# Patient Record
Sex: Male | Born: 1975 | Race: White | Hispanic: No | Marital: Single | Smoking: Current every day smoker
Health system: Southern US, Community
[De-identification: ages and names within clinical notes are randomized; demographics above are authoritative.]

## PROBLEM LIST (undated history)

## (undated) DIAGNOSIS — J189 Pneumonia, unspecified organism: Secondary | ICD-10-CM

## (undated) DIAGNOSIS — M549 Dorsalgia, unspecified: Secondary | ICD-10-CM

## (undated) HISTORY — PX: BACK SURGERY: SHX140

## (undated) HISTORY — PX: APPENDECTOMY: SHX54

---

## 1998-03-12 ENCOUNTER — Emergency Department (HOSPITAL_COMMUNITY): Admission: EM | Admit: 1998-03-12 | Discharge: 1998-03-12 | Payer: Self-pay | Admitting: Emergency Medicine

## 1998-06-19 ENCOUNTER — Encounter: Payer: Self-pay | Admitting: Neurosurgery

## 1998-06-20 ENCOUNTER — Ambulatory Visit (HOSPITAL_COMMUNITY): Admission: RE | Admit: 1998-06-20 | Discharge: 1998-06-20 | Payer: Self-pay | Admitting: Neurosurgery

## 1998-06-30 ENCOUNTER — Emergency Department (HOSPITAL_COMMUNITY): Admission: EM | Admit: 1998-06-30 | Discharge: 1998-06-30 | Payer: Self-pay | Admitting: Emergency Medicine

## 2000-02-02 ENCOUNTER — Emergency Department (HOSPITAL_COMMUNITY): Admission: EM | Admit: 2000-02-02 | Discharge: 2000-02-02 | Payer: Self-pay | Admitting: Emergency Medicine

## 2001-07-04 ENCOUNTER — Encounter: Payer: Self-pay | Admitting: Emergency Medicine

## 2001-07-04 ENCOUNTER — Emergency Department (HOSPITAL_COMMUNITY): Admission: EM | Admit: 2001-07-04 | Discharge: 2001-07-04 | Payer: Self-pay | Admitting: Emergency Medicine

## 2002-01-05 ENCOUNTER — Emergency Department (HOSPITAL_COMMUNITY): Admission: EM | Admit: 2002-01-05 | Discharge: 2002-01-05 | Payer: Self-pay | Admitting: Emergency Medicine

## 2002-01-05 ENCOUNTER — Encounter: Payer: Self-pay | Admitting: Emergency Medicine

## 2002-05-25 ENCOUNTER — Encounter: Payer: Self-pay | Admitting: Emergency Medicine

## 2002-05-25 ENCOUNTER — Emergency Department (HOSPITAL_COMMUNITY): Admission: EM | Admit: 2002-05-25 | Discharge: 2002-05-25 | Payer: Self-pay | Admitting: Emergency Medicine

## 2003-08-07 ENCOUNTER — Emergency Department (HOSPITAL_COMMUNITY): Admission: EM | Admit: 2003-08-07 | Discharge: 2003-08-07 | Payer: Self-pay | Admitting: Emergency Medicine

## 2005-06-03 ENCOUNTER — Encounter: Payer: Self-pay | Admitting: Neurosurgery

## 2006-04-27 ENCOUNTER — Emergency Department (HOSPITAL_COMMUNITY): Admission: EM | Admit: 2006-04-27 | Discharge: 2006-04-27 | Payer: Self-pay | Admitting: *Deleted

## 2006-05-04 ENCOUNTER — Emergency Department (HOSPITAL_COMMUNITY): Admission: EM | Admit: 2006-05-04 | Discharge: 2006-05-04 | Payer: Self-pay | Admitting: Emergency Medicine

## 2006-09-06 ENCOUNTER — Emergency Department (HOSPITAL_COMMUNITY): Admission: EM | Admit: 2006-09-06 | Discharge: 2006-09-06 | Payer: Self-pay | Admitting: Emergency Medicine

## 2006-10-11 ENCOUNTER — Emergency Department (HOSPITAL_COMMUNITY): Admission: EM | Admit: 2006-10-11 | Discharge: 2006-10-11 | Payer: Self-pay | Admitting: Obstetrics & Gynecology

## 2007-04-24 ENCOUNTER — Emergency Department (HOSPITAL_COMMUNITY): Admission: EM | Admit: 2007-04-24 | Discharge: 2007-04-24 | Payer: Self-pay | Admitting: Emergency Medicine

## 2007-05-02 ENCOUNTER — Ambulatory Visit (HOSPITAL_COMMUNITY): Admission: RE | Admit: 2007-05-02 | Discharge: 2007-05-02 | Payer: Self-pay | Admitting: Orthopedic Surgery

## 2008-11-19 ENCOUNTER — Emergency Department (HOSPITAL_COMMUNITY): Admission: EM | Admit: 2008-11-19 | Discharge: 2008-11-19 | Payer: Self-pay | Admitting: Emergency Medicine

## 2008-12-22 ENCOUNTER — Emergency Department (HOSPITAL_COMMUNITY): Admission: EM | Admit: 2008-12-22 | Discharge: 2008-12-23 | Payer: Self-pay | Admitting: Emergency Medicine

## 2009-12-14 ENCOUNTER — Emergency Department (HOSPITAL_BASED_OUTPATIENT_CLINIC_OR_DEPARTMENT_OTHER): Admission: EM | Admit: 2009-12-14 | Discharge: 2009-12-14 | Payer: Self-pay | Admitting: Emergency Medicine

## 2009-12-14 ENCOUNTER — Ambulatory Visit: Payer: Self-pay | Admitting: Diagnostic Radiology

## 2010-08-24 ENCOUNTER — Encounter: Payer: Self-pay | Admitting: *Deleted

## 2010-08-24 ENCOUNTER — Emergency Department (HOSPITAL_COMMUNITY)
Admission: EM | Admit: 2010-08-24 | Discharge: 2010-08-24 | Disposition: A | Discharge status: Home / Self Care | Payer: Self-pay | Attending: Emergency Medicine | Admitting: Emergency Medicine

## 2010-08-24 DIAGNOSIS — R51 Headache: Secondary | ICD-10-CM | POA: Insufficient documentation

## 2010-08-24 DIAGNOSIS — F17200 Nicotine dependence, unspecified, uncomplicated: Secondary | ICD-10-CM | POA: Insufficient documentation

## 2010-08-24 MED ORDER — OXYCODONE-ACETAMINOPHEN 5-325 MG PO TABS
1.0000 | ORAL_TABLET | Freq: Once | ORAL | Status: AC
  Administered 2010-08-24: 1 via ORAL
  Filled 2010-08-24: qty 1

## 2010-08-24 MED ORDER — KETOROLAC TROMETHAMINE 60 MG/2ML IM SOLN
60.0000 mg | Freq: Once | INTRAMUSCULAR | Status: AC
  Administered 2010-08-24: 60 mg via INTRAMUSCULAR
  Filled 2010-08-24: qty 2

## 2010-08-24 MED ORDER — HYDROCODONE-ACETAMINOPHEN 5-500 MG PO TABS: 1.0000 | ORAL_TABLET | Freq: Four times a day (QID) | ORAL | Status: AC | PRN

## 2010-08-24 NOTE — ED Provider Notes (Signed)
History     Chief Complaint  Patient presents with  . Facial Pain   Patient is a 35 y.o. male presenting with headaches. The history is provided by the patient.  Headache  This is a new problem. The current episode started more than 1 week ago. The problem occurs constantly. The problem has not changed since onset.The headache is associated with nothing. The quality of the pain is described as sharp. The pain is moderate. Radiates to: radiates from top of head towards left face. Pertinent negatives include no anorexia, no fever, no malaise/fatigue, no nausea and no vomiting. Associated symptoms comments: No weakness of arms or legs. No speech difficultiies. No facial asymetry noted. No visual changes. . He has tried NSAIDs for the symptoms. The treatment provided no relief.    History reviewed. No pertinent past medical history.  Past Surgical History  Procedure Date  . Back surgery   . Appendectomy     History reviewed. No pertinent family history.  History  Substance Use Topics  . Smoking status: Current Everyday Smoker  . Smokeless tobacco: Not on file  . Alcohol Use: Yes     occasional      Review of Systems  Constitutional: Negative for fever and malaise/fatigue.  Gastrointestinal: Negative for nausea, vomiting and anorexia.  Neurological: Positive for headaches.  All other systems reviewed and are negative.    Physical Exam  BP 114/70  Pulse 87  Temp 97.6 F (36.4 C)  Resp 20  Ht 5\' 9"  (1.753 m)  Wt 155 lb (70.308 kg)  BMI 22.89 kg/m2  SpO2 100%  Physical Exam  Nursing note and vitals reviewed. Constitutional: He is oriented to person, place, and time. He appears well-developed and well-nourished.  HENT:  Head: Normocephalic and atraumatic.  Eyes: EOM are normal.  Neck: Normal range of motion.  Cardiovascular: Normal rate, regular rhythm, normal heart sounds and intact distal pulses.   Pulmonary/Chest: Effort normal and breath sounds normal. No  respiratory distress.  Abdominal: Soft. He exhibits no distension. There is no tenderness.  Musculoskeletal: Normal range of motion.  Neurological: He is alert and oriented to person, place, and time. He has normal strength. Coordination normal. GCS eye subscore is 4. GCS verbal subscore is 5. GCS motor subscore is 6.  Skin: Skin is warm and dry.  Psychiatric: He has a normal mood and affect. Judgment normal.    ED Course  Procedures  MDM Improvement in symptoms with pain meds in the ER. No indication for imaging at this time. Will refer the pt to neuro for followup. Walking and standing in the room without difficulty      Lyanne Co, MD 08/24/10 0530

## 2010-08-24 NOTE — ED Notes (Signed)
Pt c/o facial pain/pressure starting at the top of his head to his teeth and neck. Pt states it only happens when he lies down to go to sleep.

## 2010-08-24 NOTE — ED Notes (Signed)
Pt c/o pain that starts from top of left of side of head down on left side of face into lt neck, patient noted to have broken tooth to lt upper jaw; pain ongoing for 2 weeks and worse with laying down

## 2010-11-14 LAB — COMPREHENSIVE METABOLIC PANEL
AST: 32
Albumin: 4.3
BUN: 5 — ABNORMAL LOW
CO2: 30
Calcium: 9.4
Chloride: 107
Creatinine, Ser: 0.72
GFR calc Af Amer: >60
GFR calc non Af Amer: >60
Total Bilirubin: 0.7

## 2010-11-14 LAB — DIFFERENTIAL
Basophils Absolute: 0.1
Eosinophils Relative: 2
Lymphocytes Relative: 34
Lymphs Abs: 2.8
Neutro Abs: 4.7

## 2010-11-14 LAB — LIPASE, BLOOD: Lipase: 30

## 2010-11-14 LAB — CBC
HCT: 45.7
MCHC: 35.7
MCV: 96.9
Platelets: 166

## 2011-04-01 ENCOUNTER — Encounter (HOSPITAL_BASED_OUTPATIENT_CLINIC_OR_DEPARTMENT_OTHER): Payer: Self-pay

## 2011-04-01 ENCOUNTER — Emergency Department (HOSPITAL_BASED_OUTPATIENT_CLINIC_OR_DEPARTMENT_OTHER)
Admission: EM | Admit: 2011-04-01 | Discharge: 2011-04-01 | Disposition: A | Discharge status: Home / Self Care | Payer: Self-pay | Attending: Emergency Medicine | Admitting: Emergency Medicine

## 2011-04-01 ENCOUNTER — Emergency Department (INDEPENDENT_AMBULATORY_CARE_PROVIDER_SITE_OTHER): Payer: Self-pay

## 2011-04-01 DIAGNOSIS — Z8701 Personal history of pneumonia (recurrent): Secondary | ICD-10-CM | POA: Insufficient documentation

## 2011-04-01 DIAGNOSIS — Z9889 Other specified postprocedural states: Secondary | ICD-10-CM | POA: Insufficient documentation

## 2011-04-01 DIAGNOSIS — J40 Bronchitis, not specified as acute or chronic: Secondary | ICD-10-CM | POA: Insufficient documentation

## 2011-04-01 DIAGNOSIS — F17200 Nicotine dependence, unspecified, uncomplicated: Secondary | ICD-10-CM | POA: Insufficient documentation

## 2011-04-01 DIAGNOSIS — R52 Pain, unspecified: Secondary | ICD-10-CM

## 2011-04-01 DIAGNOSIS — R509 Fever, unspecified: Secondary | ICD-10-CM

## 2011-04-01 DIAGNOSIS — R05 Cough: Secondary | ICD-10-CM

## 2011-04-01 HISTORY — DX: Pneumonia, unspecified organism: J18.9

## 2011-04-01 MED ORDER — HYDROCODONE-ACETAMINOPHEN 5-325 MG PO TABS: 2.0000 | ORAL_TABLET | ORAL | Status: AC | PRN

## 2011-04-01 MED ORDER — AZITHROMYCIN 250 MG PO TABS: 250.0000 mg | ORAL_TABLET | Freq: Every day | ORAL | Status: AC

## 2011-04-01 MED ORDER — IPRATROPIUM BROMIDE 0.02 % IN SOLN
RESPIRATORY_TRACT | Status: AC
  Filled 2011-04-01: qty 2.5

## 2011-04-01 MED ORDER — IPRATROPIUM BROMIDE 0.02 % IN SOLN
0.5000 mg | RESPIRATORY_TRACT | Status: DC
  Administered 2011-04-01: 0.5 mg via RESPIRATORY_TRACT

## 2011-04-01 MED ORDER — ALBUTEROL SULFATE (5 MG/ML) 0.5% IN NEBU
5.0000 mg | INHALATION_SOLUTION | Freq: Once | RESPIRATORY_TRACT | Status: AC
  Administered 2011-04-01: 5 mg via RESPIRATORY_TRACT
  Filled 2011-04-01: qty 1

## 2011-04-01 MED ORDER — ALBUTEROL SULFATE HFA 108 (90 BASE) MCG/ACT IN AERS: 1.0000 | INHALATION_SPRAY | Freq: Four times a day (QID) | RESPIRATORY_TRACT | Status: DC | PRN

## 2011-04-01 MED ORDER — HYDROCODONE-ACETAMINOPHEN 5-325 MG PO TABS
2.0000 | ORAL_TABLET | Freq: Once | ORAL | Status: AC
  Administered 2011-04-01: 2 via ORAL
  Filled 2011-04-01: qty 2

## 2011-04-01 NOTE — ED Provider Notes (Signed)
History     CSN: [id number]  Arrival date & time 04/01/11  1236   First MD Initiated Contact with Patient 04/01/11 1352      Chief Complaint  Patient presents with  . URI    (Consider location/radiation/quality/duration/timing/severity/associated sxs/prior treatment) Patient is a 36 y.o. male presenting with cough. The history is provided by the patient. No language interpreter was used.  Cough This is a new problem. The problem occurs constantly. The problem has been gradually worsening. The cough is productive of sputum. There has been no fever. He is not a smoker. His past medical history does not include pneumonia or asthma.  Pt complains of a cough and shortness of breath.  Pt is a smoker Pt reports he has had cough for a week.  Pt is worried taht he has pneumonia  Past Medical History  Diagnosis Date  . Pneumonia     Past Surgical History  Procedure Date  . Back surgery   . Appendectomy     No family history on file.  History  Substance Use Topics  . Smoking status: Current Everyday Smoker  . Smokeless tobacco: Not on file  . Alcohol Use: Yes     occasional      Review of Systems  Constitutional: Positive for fever.  Respiratory: Positive for cough.   All other systems reviewed and are negative.    Allergies  Review of patient's allergies indicates no known allergies.  Home Medications  No current outpatient prescriptions on file.  BP 116/52  Pulse 71  Temp(Src) 98.3 F (36.8 C) (Oral)  Resp 16  Ht 5\' 9"  (1.753 m)  Wt 155 lb (70.308 kg)  BMI 22.89 kg/m2  SpO2 98%  Physical Exam  Nursing note and vitals reviewed. Constitutional: He appears well-developed and well-nourished.  HENT:  Head: Normocephalic and atraumatic.  Right Ear: External ear normal.  Left Ear: External ear normal.  Nose: Nose normal.  Mouth/Throat: Oropharynx is clear and moist.  Eyes: Conjunctivae and EOM are normal. Pupils are equal, round, and reactive to light.    Neck: Normal range of motion. Neck supple.  Cardiovascular: Normal rate and normal heart sounds.   Pulmonary/Chest: Effort normal. He has wheezes.       Rhonchi all lobes  Abdominal: Soft. Bowel sounds are normal.  Musculoskeletal: Normal range of motion.  Neurological: He is alert.  Skin: Skin is warm.  Psychiatric: He has a normal mood and affect.    ED Course  Procedures (including critical care time)  Labs Reviewed - No data to display Dg Chest 2 View  04/01/2011  *RADIOLOGY REPORT*  Clinical Data: Cough, fever and body aches.  CHEST - 2 VIEW  Comparison: 12/14/2009.  Findings: Trachea is midline.  Heart size normal.  There may be mild scattered pulmonary parenchymal scarring.  Lungs are otherwise clear.  No pleural fluid.  IMPRESSION: No acute findings.  Original Report Authenticated By: Reyes Ivan, M.D.     No diagnosis found.    MDM     Pt given albuterol neb here.  AI will treat with zithromax and albuterol.  Pt given vicodin for pain from coughing     Langston Masker, [place] 04/01/11 1544

## 2011-04-01 NOTE — Discharge Instructions (Signed)
Smoking Cessation This document explains the best ways for you to quit smoking and new treatments to help. It lists new medicines that can double or triple your chances of quitting and quitting for good. It also considers ways to avoid relapses and concerns you may have about quitting, including weight gain. NICOTINE: A POWERFUL ADDICTION If you have tried to quit smoking, you know how hard it can be. It is hard because nicotine is a very addictive drug. For some people, it can be as addictive as heroin or cocaine. Usually, people make 2 or 3 tries, or more, before finally being able to quit. Each time you try to quit, you can learn about what helps and what hurts. Quitting takes hard work and a lot of effort, but you can quit smoking. QUITTING SMOKING IS ONE OF THE MOST IMPORTANT THINGS YOU WILL EVER DO.  You will live longer, feel better, and live better.   The impact on your body of quitting smoking is felt almost immediately:   Within 20 minutes, blood pressure decreases. Pulse returns to its normal level.   After 8 hours, carbon monoxide levels in the blood return to normal. Oxygen level increases.   After 24 hours, chance of heart attack starts to decrease. Breath, hair, and body stop smelling like smoke.   After 48 hours, damaged nerve endings begin to recover. Sense of taste and smell improve.   After 72 hours, the body is virtually free of nicotine. Bronchial tubes relax and breathing becomes easier.   After 2 to 12 weeks, lungs can hold more air. Exercise becomes easier and circulation improves.   Quitting will reduce your risk of having a heart attack, stroke, cancer, or lung disease:   After 1 year, the risk of coronary heart disease is cut in half.   After 5 years, the risk of stroke falls to the same as a nonsmoker.   After 10 years, the risk of lung cancer is cut in half and the risk of other cancers decreases significantly.   After 15 years, the risk of coronary heart  disease drops, usually to the level of a nonsmoker.   If you are pregnant, quitting smoking will improve your chances of having a healthy baby.   The people you live with, especially your children, will be healthier.   You will have extra money to spend on things other than cigarettes.  FIVE KEYS TO QUITTING Studies have shown that these 5 steps will help you quit smoking and quit for good. You have the best chances of quitting if you use them together: 1. Get ready.  2. Get support and encouragement.  3. Learn new skills and behaviors.  4. Get medicine to reduce your nicotine addiction and use it correctly.  5. Be prepared for relapse or difficult situations. Be determined to continue trying to quit, even if you do not succeed at first.  1. GET READY  Set a quit date.   Change your environment.   Get rid of ALL cigarettes, ashtrays, matches, and lighters in your home, car, and place of work.   Do not let people smoke in your home.   Review your past attempts to quit. Think about what worked and what did not.   Once you quit, do not smoke. NOT EVEN A PUFF!  2. GET SUPPORT AND ENCOURAGEMENT Studies have shown that you have a better chance of being successful if you have help. You can get support in many ways.  Tell   your family, friends, and coworkers that you are going to quit and need their support. Ask them not to smoke around you.   Talk to your caregivers (doctor, dentist, nurse, pharmacist, psychologist, and/or smoking counselor).   Get individual, group, or telephone counseling and support. The more counseling you have, the better your chances are of quitting. Programs are available at local hospitals and health centers. Call your local health department for information about programs in your area.   Spiritual beliefs and practices may help some smokers quit.   Quit meters are small computer programs online or downloadable that keep track of quit statistics, such as amount  of "quit-time," cigarettes not smoked, and money saved.   Many smokers find one or more of the many self-help books available useful in helping them quit and stay off tobacco.  3. LEARN NEW SKILLS AND BEHAVIORS  Try to distract yourself from urges to smoke. Talk to someone, go for a walk, or occupy your time with a task.   When you first try to quit, change your routine. Take a different route to work. Drink tea instead of coffee. Eat breakfast in a different place.   Do something to reduce your stress. Take a hot bath, exercise, or read a book.   Plan something enjoyable to do every day. Reward yourself for not smoking.   Explore interactive web-based programs that specialize in helping you quit.  4. GET MEDICINE AND USE IT CORRECTLY Medicines can help you stop smoking and decrease the urge to smoke. Combining medicine with the above behavioral methods and support can quadruple your chances of successfully quitting smoking. The [place] Food and Drug Administration (FDA) has approved 7 medicines to help you quit smoking. These medicines fall into 3 categories.  Nicotine replacement therapy (delivers nicotine to your body without the negative effects and risks of smoking):   Nicotine gum: Available over-the-counter.   Nicotine lozenges: Available over-the-counter.   Nicotine inhaler: Available by prescription.   Nicotine nasal spray: Available by prescription.   Nicotine skin patches (transdermal): Available by prescription and over-the-counter.   Antidepressant medicine (helps people abstain from smoking, but how this works is unknown):   Bupropion sustained-release (SR) tablets: Available by prescription.   Nicotinic receptor partial agonist (simulates the effect of nicotine in your brain):   Varenicline tartrate tablets: Available by prescription.   Ask your caregiver for advice about which medicines to use and how to use them. Carefully read the information on the package.    Everyone who is trying to quit may benefit from using a medicine. If you are pregnant or trying to become pregnant, nursing an infant, you are under age 18, or you smoke fewer than 10 cigarettes per day, talk to your caregiver before taking any nicotine replacement medicines.   You should stop using a nicotine replacement product and call your caregiver if you experience nausea, dizziness, weakness, vomiting, fast or irregular heartbeat, mouth problems with the lozenge or gum, or redness or swelling of the skin around the patch that does not go away.   Do not use any other product containing nicotine while using a nicotine replacement product.   Talk to your caregiver before using these products if you have diabetes, heart disease, asthma, stomach ulcers, you had a recent heart attack, you have high blood pressure that is not controlled with medicine, a history of irregular heartbeat, or you have been prescribed medicine to help you quit smoking.  5. BE PREPARED FOR RELAPSE OR   DIFFICULT SITUATIONS  Most relapses occur within the first 3 months after quitting. Do not be discouraged if you start smoking again. Remember, most people try several times before they finally quit.   You may have symptoms of withdrawal because your body is used to nicotine. You may crave cigarettes, be irritable, feel very hungry, cough often, get headaches, or have difficulty concentrating.   The withdrawal symptoms are only temporary. They are strongest when you first quit, but they will go away within 10 to 14 days.  Here are some difficult situations to watch for:  Alcohol. Avoid drinking alcohol. Drinking lowers your chances of successfully quitting.   Caffeine. Try to reduce the amount of caffeine you consume. It also lowers your chances of successfully quitting.   Other smokers. Being around smoking can make you want to smoke. Avoid smokers.   Weight gain. Many smokers will gain weight when they quit, usually  less than 10 pounds. Eat a healthy diet and stay active. Do not let weight gain distract you from your main goal, quitting smoking. Some medicines that help you quit smoking may also help delay weight gain. You can always lose the weight gained after you quit.   Bad mood or depression. There are a lot of ways to improve your mood other than smoking.  If you are having problems with any of these situations, talk to your caregiver. SPECIAL SITUATIONS AND CONDITIONS Studies suggest that everyone can quit smoking. Your situation or condition can give you a special reason to quit.  Pregnant women/new mothers: By quitting, you protect your baby's health and your own.   Hospitalized patients: By quitting, you reduce health problems and help healing.   Heart attack patients: By quitting, you reduce your risk of a second heart attack.   Lung, head, and neck cancer patients: By quitting, you reduce your chance of a second cancer.   Parents of children and adolescents: By quitting, you protect your children from illnesses caused by secondhand smoke.  QUESTIONS TO THINK ABOUT Think about the following questions before you try to stop smoking. You may want to talk about your answers with your caregiver.  Why do you want to quit?   If you tried to quit in the past, what helped and what did not?   What will be the most difficult situations for you after you quit? How will you plan to handle them?   Who can help you through the tough times? Your family? Friends? Caregiver?   What pleasures do you get from smoking? What ways can you still get pleasure if you quit?  Here are some questions to ask your caregiver:  How can you help me to be successful at quitting?   What medicine do you think would be best for me and how should I take it?   What should I do if I need more help?   What is smoking withdrawal like? How can I get information on withdrawal?  Quitting takes hard work and a lot of effort,  but you can quit smoking. FOR MORE INFORMATION  Smokefree.gov (http://www.smokefree.gov) provides free, accurate, evidence-based information and professional assistance to help support the immediate and long-term needs of people trying to quit smoking. Document Released: 01/13/2001 Document Revised: 10/01/2010 Document Reviewed: 11/05/2008 Exit[employer] Patient Information 2012 [employer].Bronchitis Bronchitis is the body's way of reacting to injury and/or infection (inflammation) of the bronchi. Bronchi are the air tubes that extend from the windpipe into the lungs. If the inflammation   becomes severe, it may cause shortness of breath. CAUSES  Inflammation may be caused by:  A virus.   Germs (bacteria).   Dust.   Allergens.   Pollutants and many other irritants.  The cells lining the bronchial tree are covered with tiny hairs (cilia). These constantly beat upward, away from the lungs, toward the mouth. This keeps the lungs free of pollutants. When these cells become too irritated and are unable to do their job, mucus begins to develop. This causes the characteristic cough of bronchitis. The cough clears the lungs when the cilia are unable to do their job. Without either of these protective mechanisms, the mucus would settle in the lungs. Then you would develop pneumonia. Smoking is a common cause of bronchitis and can contribute to pneumonia. Stopping this habit is the single most important thing you can do to help yourself. TREATMENT   Your caregiver may prescribe an antibiotic if the cough is caused by bacteria. Also, medicines that open up your airways make it easier to breathe. Your caregiver may also recommend or prescribe an expectorant. It will loosen the mucus to be coughed up. Only take over-the-counter or prescription medicines for pain, discomfort, or fever as directed by your caregiver.   Removing whatever causes the problem (smoking, for example) is critical to preventing the  problem from getting worse.   Cough suppressants may be prescribed for relief of cough symptoms.   Inhaled medicines may be prescribed to help with symptoms now and to help prevent problems from returning.   For those with recurrent (chronic) bronchitis, there may be a need for steroid medicines.  SEEK IMMEDIATE MEDICAL CARE IF:   During treatment, you develop more pus-like mucus (purulent sputum).   You have a fever.   Your baby is older than 3 months with a rectal temperature of 102 F (38.9 C) or higher.   Your baby is 3 months old or younger with a rectal temperature of 100.4 F (38 C) or higher.   You become progressively more ill.   You have increased difficulty breathing, wheezing, or shortness of breath.  It is necessary to seek immediate medical care if you are elderly or sick from any other disease. MAKE SURE YOU:   Understand these instructions.   Will watch your condition.   Will get help right away if you are not doing well or get worse.  Document Released: 01/19/2005 Document Revised: 10/01/2010 Document Reviewed: 11/29/2007 Exit[employer] Patient Information 2012 [employer]. 

## 2011-04-01 NOTE — ED Notes (Signed)
nonprod cough, sob, body aches, left ear ache x 1week

## 2011-04-02 NOTE — ED Provider Notes (Signed)
Medical screening examination/treatment/procedure(s) were performed by non-physician practitioner and as supervising physician I was immediately available for consultation/collaboration.   Laurisa Sahakian, MD 04/02/11 1034 

## 2012-07-31 ENCOUNTER — Emergency Department (HOSPITAL_BASED_OUTPATIENT_CLINIC_OR_DEPARTMENT_OTHER): Payer: Self-pay

## 2012-07-31 ENCOUNTER — Encounter (HOSPITAL_BASED_OUTPATIENT_CLINIC_OR_DEPARTMENT_OTHER): Payer: Self-pay

## 2012-07-31 ENCOUNTER — Emergency Department (HOSPITAL_BASED_OUTPATIENT_CLINIC_OR_DEPARTMENT_OTHER)
Admission: EM | Admit: 2012-07-31 | Discharge: 2012-07-31 | Disposition: A | Discharge status: Home / Self Care | Payer: Self-pay | Attending: Orthopedic Surgery | Admitting: Orthopedic Surgery

## 2012-07-31 DIAGNOSIS — Z23 Encounter for immunization: Secondary | ICD-10-CM | POA: Insufficient documentation

## 2012-07-31 DIAGNOSIS — IMO0002 Reserved for concepts with insufficient information to code with codable children: Secondary | ICD-10-CM | POA: Insufficient documentation

## 2012-07-31 DIAGNOSIS — L02511 Cutaneous abscess of right hand: Secondary | ICD-10-CM

## 2012-07-31 DIAGNOSIS — F17200 Nicotine dependence, unspecified, uncomplicated: Secondary | ICD-10-CM | POA: Insufficient documentation

## 2012-07-31 DIAGNOSIS — Z8701 Personal history of pneumonia (recurrent): Secondary | ICD-10-CM | POA: Insufficient documentation

## 2012-07-31 LAB — BASIC METABOLIC PANEL
Calcium: 9.6 mg/dL (ref 8.4–10.5)
Creatinine, Ser: 0.8 mg/dL (ref 0.50–1.35)
GFR calc Af Amer: >90 mL/min (ref >90)

## 2012-07-31 LAB — CBC WITH DIFFERENTIAL/PLATELET
Basophils Absolute: 0 10*3/uL (ref 0.0–0.1)
Basophils Relative: 0 % (ref 0–1)
Eosinophils Relative: 3 % (ref 0–5)
HCT: 37.7 % — ABNORMAL LOW (ref 39.0–52.0)
Hemoglobin: 13.9 g/dL (ref 13.0–17.0)
MCH: 34.5 pg — ABNORMAL HIGH (ref 26.0–34.0)
MCHC: 36.9 g/dL — ABNORMAL HIGH (ref 30.0–36.0)
MCV: 93.5 fL (ref 78.0–100.0)
Monocytes Absolute: 0.9 10*3/uL (ref 0.1–1.0)
RBC: 4.03 MIL/uL — ABNORMAL LOW (ref 4.22–5.81)
RDW: 12.4 % (ref 11.5–15.5)
WBC: 9.7 10*3/uL (ref 4.0–10.5)

## 2012-07-31 LAB — SEDIMENTATION RATE: Sed Rate: 13 mm/hr (ref 0–16)

## 2012-07-31 MED ORDER — KETOROLAC TROMETHAMINE 30 MG/ML IJ SOLN: 30.0000 mg | Freq: Once | INTRAMUSCULAR | Status: DC

## 2012-07-31 MED ORDER — NAPROXEN 250 MG PO TABS
500.0000 mg | ORAL_TABLET | Freq: Once | ORAL | Status: AC
  Administered 2012-07-31: 500 mg via ORAL
  Filled 2012-07-31 (×2): qty 2

## 2012-07-31 MED ORDER — SODIUM CHLORIDE 0.9 % IV SOLN
3.0000 g | Freq: Once | INTRAVENOUS | Status: AC
  Administered 2012-07-31: 3 g via INTRAVENOUS
  Filled 2012-07-31: qty 3

## 2012-07-31 MED ORDER — VANCOMYCIN HCL IN DEXTROSE 1-5 GM/200ML-% IV SOLN
INTRAVENOUS | Status: AC
  Administered 2012-07-31: 1 g
  Filled 2012-07-31: qty 200

## 2012-07-31 MED ORDER — VANCOMYCIN HCL 500 MG IV SOLR: 1000.0000 mg | Freq: Once | INTRAVENOUS | Status: AC

## 2012-07-31 MED ORDER — OXYCODONE-ACETAMINOPHEN 5-325 MG PO TABS: 1.0000 | ORAL_TABLET | ORAL | Status: DC | PRN

## 2012-07-31 MED ORDER — TETANUS-DIPHTH-ACELL PERTUSSIS 5-2.5-18.5 LF-MCG/0.5 IM SUSP
0.5000 mL | Freq: Once | INTRAMUSCULAR | Status: AC
  Administered 2012-07-31: 0.5 mL via INTRAMUSCULAR
  Filled 2012-07-31: qty 0.5

## 2012-07-31 MED ORDER — SODIUM CHLORIDE 0.9 % IV SOLN: 3.0000 g | Freq: Once | INTRAVENOUS | Status: DC

## 2012-07-31 MED ORDER — FENTANYL CITRATE 0.05 MG/ML IJ SOLN
50.0000 ug | Freq: Once | INTRAMUSCULAR | Status: AC
  Administered 2012-07-31: 50 ug via INTRAVENOUS
  Filled 2012-07-31: qty 2

## 2012-07-31 MED ORDER — AMOXICILLIN-POT CLAVULANATE 875-125 MG PO TABS: 1.0000 | ORAL_TABLET | Freq: Two times a day (BID) | ORAL | Status: DC

## 2012-07-31 MED ORDER — OXYCODONE-ACETAMINOPHEN 5-325 MG PO TABS: 1.0000 | ORAL_TABLET | Freq: Four times a day (QID) | ORAL | Status: DC | PRN

## 2012-07-31 NOTE — ED Provider Notes (Signed)
   History    CSN: [id number] Arrival date & time 07/31/12  4098  First MD Initiated Contact with Patient 07/31/12 0355     Chief Complaint  Patient presents with  . Finger Injury   (Consider location/radiation/quality/duration/timing/severity/associated sxs/prior Treatment) Patient is a 37 y.o. male presenting with hand pain. The history is provided by the patient.  Hand Pain This is a new problem. The current episode started more than 1 week ago. The problem occurs constantly. The problem has been gradually worsening. Pertinent negatives include no chest pain, no abdominal pain, no headaches and no shortness of breath. Nothing aggravates the symptoms. Nothing relieves the symptoms. He has tried nothing for the symptoms. The treatment provided no relief.  Had a spinter at the DIP of the right middle finger approximately 8 days ago that he removed manually then swelling and worsening pain and noted white under the skin on the palmar aspect at the DIP Past Medical History  Diagnosis Date  . Pneumonia    Past Surgical History  Procedure Laterality Date  . Back surgery    . Appendectomy     No family history on file. History  Substance Use Topics  . Smoking status: Current Every Day Smoker  . Smokeless tobacco: Not on file  . Alcohol Use: Yes     Comment: occasional    Review of Systems  Constitutional: Negative for fever.  Respiratory: Negative for shortness of breath.   Cardiovascular: Negative for chest pain.  Gastrointestinal: Negative for abdominal pain.  Neurological: Negative for headaches.  All other systems reviewed and are negative.    Allergies  Review of patient's allergies indicates no known allergies.  Home Medications  No current outpatient prescriptions on file. BP 118/62  Pulse 72  Temp(Src) 98.4 F (36.9 C) (Oral)  Resp 16  SpO2 100% Physical Exam  Constitutional: He is oriented to person, place, and time. He appears well-developed and  well-nourished. No distress.  HENT:  Head: Normocephalic and atraumatic.  Mouth/Throat: Oropharynx is clear and moist.  Eyes: Conjunctivae are normal. Pupils are equal, round, and reactive to light.  Neck: Normal range of motion. Neck supple.  Cardiovascular: Normal rate, regular rhythm and intact distal pulses.   Pulmonary/Chest: Effort normal and breath sounds normal. He has no wheezes. He has no rales.  Abdominal: Soft. Bowel sounds are normal.  Musculoskeletal: Normal range of motion.       Right hand: He exhibits normal capillary refill.       Hands: Neurological: He is alert and oriented to person, place, and time.  Skin: Skin is warm and dry.    ED Course  Procedures (including critical care time) Labs Reviewed - No data to display No results found. No diagnosis found. Medications  Ampicillin-Sulbactam (UNASYN) 3 g in sodium chloride 0.9 % 100 mL IVPB (not administered)  naproxen (NAPROSYN) tablet 500 mg (500 mg Oral Given 07/31/12 0410)  TDaP (BOOSTRIX) injection 0.5 mL (0.5 mLs Intramuscular Given 07/31/12 0410)  fentaNYL (SUBLIMAZE) injection 50 mcg (50 mcg Intravenous Given 07/31/12 0507)    Tetanus updated MDM  Case d/w Dr. Janee Morn of hand who will see the patient, no antibiotics at this time 513 am Dr. Janee Morn of Hand Surgery present in the Ed to see the patient, see his note separately  Farzad Tibbetts Smitty Cords, MD 07/31/12 [phone]

## 2012-07-31 NOTE — ED Notes (Signed)
Dr. Janee Morn at bedside to assess finger. EMT at bedside for assistance

## 2012-07-31 NOTE — Consult Note (Signed)
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  He removed it, thinking he had removed all of it.  The past couple of days, it has become more painful and swollen.  Past Medical History  Diagnosis Date  . Pneumonia    Past Surgical History  Procedure Laterality Date  . Back surgery    . Appendectomy     History   Social History  . Marital Status: Single    Spouse Name: N/A    Number of Children: N/A  . Years of Education: N/A   Social History Main Topics  . Smoking status: Current Every Day Smoker  . Smokeless tobacco: None  . Alcohol Use: Yes     Comment: occasional  . Drug Use: Yes    Special: Marijuana  . Sexually Active:    Other Topics Concern  . None   Social History Narrative  . None   History reviewed. No pertinent family history. No Known Allergies Prior to Admission medications   Medication Sig Start Date End Date Taking? Authorizing Provider  amoxicillin-clavulanate (AUGMENTIN) 875-125 MG per tablet Take 1 tablet by mouth 2 (two) times daily. 07/31/12   Jodi Marble, MD  oxyCODONE-acetaminophen (ROXICET) 5-325 MG per tablet Take 1-2 tablets by mouth every 4 (four) hours as needed for pain. 07/31/12   Jodi Marble, MD   Dg Hand Complete Right  07/31/2012   *RADIOLOGY REPORT*  Clinical Data: Pain in middle finger pain and swelling for several days.  Rule out foreign body.  RIGHT HAND - COMPLETE 3+ VIEW  Comparison: 09/06/2006  Findings: Subtle increased density is identified just radial the proximal phalanx of the second digit.  This was similar on the 2008 exam.  No unexpected radiopaque foreign objects are seen.  No soft tissue gas.  No osseous destruction or acute osseous finding.  IMPRESSION: No evidence of acute radiopaque foreign object.  1 mm focus of increased density  projecting adjacent the proximal phalanx of the second digit, favored to be similar to on the prior exam.  This could be a vascular or dystrophic soft tissue calcification.  Chronic foreign body is felt unlikely.   Original Report Authenticated By: Jeronimo Greaves, M.D.    Positive ROS: All other systems have been reviewed and were otherwise negative with the exception of those mentioned in the HPI and as above.  Physical Exam: Vitals: Refer to EMR. Constitutional:  WD, WN, NAD HEENT:  NCAT, EOMI Neuro/Psych:  Alert & oriented to person, place, and time; appropriate mood & affect Lymphatic: No generalized UE edema or lymphadenopathy Extremities / MSK:  The extremities are normal with respect to appearance, ranges of motion, joint stability, muscle strength/tone, sensation, & perfusion except as otherwise noted:   R LF with subcutaneous abscess at level of DIPJ.  Puncture was volar, slightly ulnar of midline.  Epidermal desquamation occuring in dime-sized area.  He can actively extend the digit fully without significant increase in pain.  Mild edema of digit at P1/P2 level.  He can actively flex the digit moderately well before limited by tightness.  No significant pain increase with passive DIPJ ROM.  Intact LT sens at tip  Assessment: R LF with probable SQ abscess at volar DIPJ level--Very low suspicion for septic flexor tenosynovitis  Plan: Digital block performed.  Excisional debridement  of skin performed, along with incision into the SQ as a part of a Bruner incision.  Purulence released & cultures obtained, along with removal of a small amount of thick, snotty material from round full-thickness wound in midline, not viewable until the epidermal covering was debrided away.  Spreading dissection performed prox & distal, no more purulence encountered.  Wound copiously irrigated in sink and packed with gauze.  Dressing applied. D/c instructions given.  To receive Unasyn and Vanc here, already with  Naproxen & tetanus booster. D/c with percocet, augmentin, & instructions for Aleve 2  BID.  Office to call patient tomorrow to arrange f/u [phone])  Cliffton Asters. Janee Morn, MD     Mobile [phone] Orthopaedic & Hand Surgery [facility] [address] [place], [place]  [address] [phone]

## 2012-07-31 NOTE — ED Notes (Signed)
MD at bedside. 

## 2012-07-31 NOTE — ED Notes (Signed)
Patient here with right hand middle finger pain and swelling x several days. Reports that he thinks that there may be a splinter in same. Pain with any movement

## 2012-08-03 LAB — WOUND CULTURE: Gram Stain: NONE SEEN

## 2012-08-03 NOTE — ED Notes (Deleted)
Wrong number letter sent to [facility] address.

## 2012-08-03 NOTE — ED Notes (Signed)
Post ED Visit - Positive Culture Follow-up: Successful Patient Follow-Up  Culture assessed and recommendations reviewed by: []  Wes Dulaney, Pharm.D., BCPS []  Celedonio Miyamoto, [address].D., BCPS [x]  Georgina Pillion, [address].D., BCPS []  [place], [address].D., BCPS, AAHIVP []  Estella Husk, Pharm.D., BCPS, AAHIVP  Positive wound culture  []  Patient discharged without antimicrobial prescription and treatment is now indicated [x]  Organism is resistant to prescribed ED discharge antimicrobial []  Patient with positive blood cultures  Changes discussed with ED provider: Ethelda Chick New antibiotic prescription d/c Augmentin and change to Doxycyline 100 mg twice daily x 10 days    Contacted patient,Wrong Number date 08/03/2012, time 1752   Larena Sox 08/03/2012, 6:55 PM

## 2012-08-03 NOTE — Progress Notes (Signed)
ED Antimicrobial Stewardship Positive Culture Follow Up   THORVALD ORSINO is an 37 y.o. male who presented to [facility] on 07/31/2012 with a chief complaint of R finger pain/swelling  Chief Complaint  Patient presents with  . Finger Injury    Recent Results (from the past 720 hour(s))  WOUND CULTURE     Status: None   Collection Time    07/31/12  5:47 AM      Result Value Range Status   Specimen Description FINGER   Final   Special Requests Normal   Final   Gram Stain     Final   Value: NO WBC SEEN     NO SQUAMOUS EPITHELIAL CELLS SEEN     FEW GRAM POSITIVE COCCI     IN CLUSTERS   Culture     Final   Value: ABUNDANT METHICILLIN RESISTANT STAPHYLOCOCCUS AUREUS     Note: RIFAMPIN AND GENTAMICIN SHOULD NOT BE USED AS SINGLE DRUGS FOR TREATMENT OF STAPH INFECTIONS. This organism DOES NOT demonstrate inducible Clindamycin resistance in vitro. CRITICAL RESULT CALLED TO, READ BACK BY AND VERIFIED WITH: SHANNON GAMMONS      08/03/12 0835 BY SMITHERSJ   Report Status 08/03/2012 FINAL   Final   Organism ID, Bacteria METHICILLIN RESISTANT STAPHYLOCOCCUS AUREUS   Final    [x]  Treated with Augmentin, organism resistant to prescribed antimicrobial []  Patient discharged originally without antimicrobial agent and treatment is now indicated  New antibiotic prescription: Doxycycline 100 mg twice daily for 10 days  ED Provider: Dierdre Forth, PA-C  Rolley Sims 08/03/2012, 10:39 AM Infectious Diseases Pharmacist Phone# [phone]

## 2012-08-04 ENCOUNTER — Telehealth (HOSPITAL_COMMUNITY): Payer: Self-pay | Admitting: *Deleted

## 2012-08-04 NOTE — ED Notes (Signed)
Post ED Visit - Positive Culture Follow-up  Culture report reviewed by antimicrobial stewardship pharmacist: []  Wes Dulaney, Pharm.D., BCPS [x]  Celedonio Miyamoto, Pharm.D., BCPS []  Georgina Pillion, Pharm.D., BCPS []  [place], [address].D., BCPS, AAHIVP []  Estella Husk, Pharm.D., BCPS, AAHIVP  Positive wound culture Treated with Doxycyline by Pharmacist, organism sensitive to the same and no further patient follow-up is required at this time.  Larena Sox 08/04/2012, 3:36 PM

## 2012-08-07 ENCOUNTER — Telehealth (HOSPITAL_COMMUNITY): Payer: Self-pay | Admitting: *Deleted

## 2012-08-10 NOTE — ED Notes (Signed)
Unable to contact via phone( wrong number) letter sent to [facility] address.-

## 2012-12-13 ENCOUNTER — Encounter (HOSPITAL_BASED_OUTPATIENT_CLINIC_OR_DEPARTMENT_OTHER): Payer: Self-pay | Admitting: Emergency Medicine

## 2012-12-13 ENCOUNTER — Emergency Department (HOSPITAL_BASED_OUTPATIENT_CLINIC_OR_DEPARTMENT_OTHER)
Admission: EM | Admit: 2012-12-13 | Discharge: 2012-12-13 | Disposition: A | Discharge status: Home / Self Care | Payer: Self-pay | Attending: Emergency Medicine | Admitting: Emergency Medicine

## 2012-12-13 DIAGNOSIS — N4289 Other specified disorders of prostate: Secondary | ICD-10-CM | POA: Insufficient documentation

## 2012-12-13 DIAGNOSIS — Z8701 Personal history of pneumonia (recurrent): Secondary | ICD-10-CM | POA: Insufficient documentation

## 2012-12-13 DIAGNOSIS — F17200 Nicotine dependence, unspecified, uncomplicated: Secondary | ICD-10-CM | POA: Insufficient documentation

## 2012-12-13 DIAGNOSIS — N419 Inflammatory disease of prostate, unspecified: Secondary | ICD-10-CM | POA: Insufficient documentation

## 2012-12-13 LAB — URINALYSIS, ROUTINE W REFLEX MICROSCOPIC
Bilirubin Urine: NEGATIVE
Hgb urine dipstick: NEGATIVE
Ketones, ur: NEGATIVE mg/dL
Specific Gravity, Urine: 1.015 (ref 1.005–1.030)
pH: 8 (ref 5.0–8.0)

## 2012-12-13 LAB — URINE MICROSCOPIC-ADD ON

## 2012-12-13 MED ORDER — CIPROFLOXACIN HCL 500 MG PO TABS
500.0000 mg | ORAL_TABLET | Freq: Once | ORAL | Status: AC
  Administered 2012-12-13: 500 mg via ORAL
  Filled 2012-12-13: qty 1

## 2012-12-13 MED ORDER — HYDROCODONE-ACETAMINOPHEN 5-325 MG PO TABS: 1.0000 | ORAL_TABLET | Freq: Four times a day (QID) | ORAL | Status: DC | PRN

## 2012-12-13 MED ORDER — CEFTRIAXONE SODIUM 250 MG IJ SOLR
250.0000 mg | Freq: Once | INTRAMUSCULAR | Status: AC
  Administered 2012-12-13: 250 mg via INTRAMUSCULAR
  Filled 2012-12-13: qty 250

## 2012-12-13 MED ORDER — CIPROFLOXACIN HCL 500 MG PO TABS: 500.0000 mg | ORAL_TABLET | Freq: Two times a day (BID) | ORAL | Status: DC

## 2012-12-13 MED ORDER — LIDOCAINE HCL (PF) 1 % IJ SOLN
INTRAMUSCULAR | Status: AC
  Administered 2012-12-13: 1.5 mL
  Filled 2012-12-13: qty 5

## 2012-12-13 NOTE — ED Notes (Signed)
C/o fever and burning with urination x 1 week, some clear penile discharge per pt

## 2012-12-13 NOTE — ED Provider Notes (Addendum)
CSN: [id number]     Arrival date & time 12/13/12  0541 History   First MD Initiated Contact with Patient 12/13/12 0547     Chief Complaint  Patient presents with  . Dysuria   (Consider location/radiation/quality/duration/timing/severity/associated sxs/prior Treatment) HPI This is a 37 year old male with a one-week history of dysuria. He complains of severe pain when urinating. He states it "burns like fire". He had a fever to 102 earlier in the week but none currently. He is not having chills or back pain. He is not having flank pain. He has had some watery penile discharge but no purulent discharge and no staining in his underwear.  Past Medical History  Diagnosis Date  . Pneumonia    Past Surgical History  Procedure Laterality Date  . Back surgery    . Appendectomy     History reviewed. No pertinent family history. History  Substance Use Topics  . Smoking status: Current Every Day Smoker  . Smokeless tobacco: Not on file  . Alcohol Use: Yes     Comment: occasional    Review of Systems  All other systems reviewed and are negative.    Allergies  Review of patient's allergies indicates no known allergies.  Home Medications   Current Outpatient Rx  Name  Route  Sig  Dispense  Refill  . amoxicillin-clavulanate (AUGMENTIN) 875-125 MG per tablet   Oral   Take 1 tablet by mouth 2 (two) times daily. One po bid x 10 days   20 tablet   0   . ciprofloxacin (CIPRO) 500 MG tablet   Oral   Take 1 tablet (500 mg total) by mouth 2 (two) times daily.   28 tablet   0   . HYDROcodone-acetaminophen (NORCO/VICODIN) 5-325 MG per tablet   Oral   Take 1-2 tablets by mouth every 6 (six) hours as needed for moderate pain.   20 tablet   0   . oxyCODONE-acetaminophen (PERCOCET) 5-325 MG per tablet   Oral   Take 1 tablet by mouth every 6 (six) hours as needed for pain.   13 tablet   0    BP 128/69  Pulse 86  Temp(Src) 97.3 F (36.3 C) (Oral)  Resp 16  Ht 5\' 9"  (1.753 m)   Wt 155 lb (70.308 kg)  BMI 22.88 kg/m2  SpO2 100%  Physical Exam General: Well-developed, well-nourished male in no acute distress; appearance consistent with age of record HENT: normocephalic; atraumatic Eyes: pupils equal, round and reactive to light; extraocular muscles intact Neck: supple Heart: regular rate and rhythm; Lungs: clear to auscultation bilaterally Abdomen: soft; nondistended; nontender; no masses or hepatosplenomegaly; bowel sounds present GU: Tanner 4 male, circumcised; no urethral discharge or meatal erythema Rectal: Normal sphincter tone; prostate asymmetric with enlarged left lobe and prostatic tenderness Extremities: No deformity; full range of motion Neurologic: Awake, alert and oriented; motor function intact in all extremities and symmetric; no facial droop Skin: Warm and dry Psychiatric: Flat affect    ED Course  Procedures (including critical care time)   MDM   Nursing notes and vitals signs, including pulse oximetry, reviewed.  Summary of this visit's results, reviewed by myself:  Labs:  Results for orders placed during the hospital encounter of 12/13/12 (from the past 24 hour(s))  URINALYSIS, ROUTINE W REFLEX MICROSCOPIC     Status: Abnormal   Collection Time    12/13/12  5:55 AM      Result Value Range   Color, Urine YELLOW  YELLOW  APPearance CLEAR  CLEAR   Specific Gravity, Urine 1.015  1.005 - 1.030   pH 8.0  5.0 - 8.0   Glucose, UA NEGATIVE  NEGATIVE mg/dL   Hgb urine dipstick NEGATIVE  NEGATIVE   Bilirubin Urine NEGATIVE  NEGATIVE   Ketones, ur NEGATIVE  NEGATIVE mg/dL   Protein, ur NEGATIVE  NEGATIVE mg/dL   Urobilinogen, UA 1.0  0.0 - 1.0 mg/dL   Nitrite NEGATIVE  NEGATIVE   Leukocytes, UA TRACE (*) NEGATIVE  URINE MICROSCOPIC-ADD ON     Status: None   Collection Time    12/13/12  5:55 AM      Result Value Range   Squamous Epithelial / LPF RARE  RARE   WBC, UA 7-10  <3 WBC/hpf   RBC / HPF 0-2  <3 RBC/hpf   Bacteria, UA RARE   RARE   6:40 AM Patient advised of physical findings worrisome for prostate cancer. We will check a PSA and refer him to urology.    Hanley Seamen, MD 12/13/12 1610  Hanley Seamen, MD 12/13/12 [phone]

## 2012-12-13 NOTE — ED Notes (Signed)
MD at bedside. 

## 2012-12-14 ENCOUNTER — Telehealth (HOSPITAL_BASED_OUTPATIENT_CLINIC_OR_DEPARTMENT_OTHER): Payer: Self-pay | Admitting: *Deleted

## 2012-12-14 ENCOUNTER — Telehealth (HOSPITAL_BASED_OUTPATIENT_CLINIC_OR_DEPARTMENT_OTHER): Payer: Self-pay

## 2012-12-16 ENCOUNTER — Encounter (HOSPITAL_COMMUNITY): Payer: Self-pay | Admitting: Emergency Medicine

## 2012-12-16 ENCOUNTER — Emergency Department (HOSPITAL_COMMUNITY): Payer: Self-pay

## 2012-12-16 ENCOUNTER — Inpatient Hospital Stay (HOSPITAL_COMMUNITY)
Admission: EM | Admit: 2012-12-16 | Discharge: 2012-12-18 | DRG: 717 | Disposition: A | Discharge status: Home / Self Care | Payer: Self-pay | Attending: Internal Medicine | Admitting: Internal Medicine

## 2012-12-16 DIAGNOSIS — R5381 Other malaise: Secondary | ICD-10-CM | POA: Diagnosis present

## 2012-12-16 DIAGNOSIS — L738 Other specified follicular disorders: Secondary | ICD-10-CM | POA: Diagnosis present

## 2012-12-16 DIAGNOSIS — N151 Renal and perinephric abscess: Secondary | ICD-10-CM | POA: Diagnosis present

## 2012-12-16 DIAGNOSIS — Z720 Tobacco use: Secondary | ICD-10-CM

## 2012-12-16 DIAGNOSIS — F1410 Cocaine abuse, uncomplicated: Secondary | ICD-10-CM | POA: Diagnosis present

## 2012-12-16 DIAGNOSIS — R509 Fever, unspecified: Secondary | ICD-10-CM | POA: Diagnosis present

## 2012-12-16 DIAGNOSIS — N412 Abscess of prostate: Principal | ICD-10-CM | POA: Diagnosis present

## 2012-12-16 DIAGNOSIS — R61 Generalized hyperhidrosis: Secondary | ICD-10-CM | POA: Diagnosis present

## 2012-12-16 DIAGNOSIS — L739 Follicular disorder, unspecified: Secondary | ICD-10-CM | POA: Diagnosis present

## 2012-12-16 DIAGNOSIS — F1210 Cannabis abuse, uncomplicated: Secondary | ICD-10-CM | POA: Diagnosis present

## 2012-12-16 DIAGNOSIS — F17200 Nicotine dependence, unspecified, uncomplicated: Secondary | ICD-10-CM | POA: Diagnosis present

## 2012-12-16 MED ORDER — OXYCODONE-ACETAMINOPHEN 5-325 MG PO TABS: 2.0000 | ORAL_TABLET | Freq: Once | ORAL | Status: DC

## 2012-12-16 MED ORDER — IOHEXOL 300 MG/ML  SOLN
50.0000 mL | Freq: Once | INTRAMUSCULAR | Status: AC | PRN
  Administered 2012-12-16: 50 mL via ORAL

## 2012-12-16 MED ORDER — HYDROMORPHONE HCL PF 1 MG/ML IJ SOLN
1.0000 mg | Freq: Once | INTRAMUSCULAR | Status: AC
  Administered 2012-12-16: 1 mg via INTRAVENOUS
  Filled 2012-12-16: qty 1

## 2012-12-16 NOTE — ED Notes (Signed)
Pt arrived to ED with a complaint of prostate pain.  Pt was seen Wednesday for same. Pt is unable to get to urologist or PCP and is in pain.

## 2012-12-17 ENCOUNTER — Inpatient Hospital Stay (HOSPITAL_COMMUNITY): Payer: Self-pay | Admitting: Anesthesiology

## 2012-12-17 ENCOUNTER — Encounter (HOSPITAL_COMMUNITY)
Admission: EM | Disposition: A | Discharge status: Home / Self Care | Payer: Self-pay | Source: Home / Self Care | Attending: Internal Medicine

## 2012-12-17 ENCOUNTER — Inpatient Hospital Stay (HOSPITAL_COMMUNITY): Payer: Self-pay

## 2012-12-17 ENCOUNTER — Encounter (HOSPITAL_COMMUNITY): Payer: Self-pay | Admitting: Anesthesiology

## 2012-12-17 ENCOUNTER — Encounter (HOSPITAL_COMMUNITY): Payer: Self-pay | Admitting: *Deleted

## 2012-12-17 DIAGNOSIS — N151 Renal and perinephric abscess: Secondary | ICD-10-CM

## 2012-12-17 DIAGNOSIS — L739 Follicular disorder, unspecified: Secondary | ICD-10-CM | POA: Diagnosis present

## 2012-12-17 DIAGNOSIS — L738 Other specified follicular disorders: Secondary | ICD-10-CM

## 2012-12-17 DIAGNOSIS — N412 Abscess of prostate: Secondary | ICD-10-CM | POA: Diagnosis present

## 2012-12-17 DIAGNOSIS — F17200 Nicotine dependence, unspecified, uncomplicated: Secondary | ICD-10-CM

## 2012-12-17 DIAGNOSIS — Z720 Tobacco use: Secondary | ICD-10-CM | POA: Diagnosis present

## 2012-12-17 HISTORY — PX: TRANSURETHRAL RESECTION OF PROSTATE: SHX73

## 2012-12-17 LAB — CBC WITH DIFFERENTIAL/PLATELET
Basophils Absolute: 0 10*3/uL (ref 0.0–0.1)
Basophils Relative: 0 % (ref 0–1)
HCT: 41.1 % (ref 39.0–52.0)
MCHC: 35.8 g/dL (ref 30.0–36.0)
Monocytes Absolute: 0.9 10*3/uL (ref 0.1–1.0)
Neutro Abs: 9.5 10*3/uL — ABNORMAL HIGH (ref 1.7–7.7)
Neutrophils Relative %: 74 % (ref 43–77)
Platelets: 280 10*3/uL (ref 150–400)
RDW: 12.6 % (ref 11.5–15.5)

## 2012-12-17 LAB — BASIC METABOLIC PANEL
Chloride: 102 mEq/L (ref 96–112)
Creatinine, Ser: 0.8 mg/dL (ref 0.50–1.35)
GFR calc Af Amer: >90 mL/min (ref >90)

## 2012-12-17 LAB — URINE MICROSCOPIC-ADD ON

## 2012-12-17 LAB — URINALYSIS, ROUTINE W REFLEX MICROSCOPIC
Ketones, ur: NEGATIVE mg/dL
Nitrite: NEGATIVE
Protein, ur: NEGATIVE mg/dL
Urobilinogen, UA: 1 mg/dL (ref 0.0–1.0)

## 2012-12-17 LAB — RAPID URINE DRUG SCREEN, HOSP PERFORMED
Amphetamines: POSITIVE — AB
Barbiturates: NOT DETECTED

## 2012-12-17 LAB — HIV ANTIBODY (ROUTINE TESTING W REFLEX): HIV: NONREACTIVE

## 2012-12-17 LAB — SURGICAL PCR SCREEN
MRSA, PCR: NEGATIVE
Staphylococcus aureus: NEGATIVE

## 2012-12-17 SURGERY — TURP (TRANSURETHRAL RESECTION OF PROSTATE)
Anesthesia: General | Site: Prostate | Wound class: Dirty or Infected

## 2012-12-17 MED ORDER — FENTANYL CITRATE 0.05 MG/ML IJ SOLN
INTRAMUSCULAR | Status: DC | PRN
  Administered 2012-12-17: 25 ug via INTRAVENOUS
  Administered 2012-12-17: 75 ug via INTRAVENOUS
  Administered 2012-12-17 (×3): 50 ug via INTRAVENOUS

## 2012-12-17 MED ORDER — HYDROMORPHONE HCL PF 1 MG/ML IJ SOLN
0.2500 mg | INTRAMUSCULAR | Status: DC | PRN
  Administered 2012-12-17 (×4): 0.5 mg via INTRAVENOUS

## 2012-12-17 MED ORDER — MIDAZOLAM HCL 5 MG/5ML IJ SOLN
INTRAMUSCULAR | Status: DC | PRN
  Administered 2012-12-17: 1 mg via INTRAVENOUS

## 2012-12-17 MED ORDER — HYDROMORPHONE HCL PF 1 MG/ML IJ SOLN
1.0000 mg | INTRAMUSCULAR | Status: DC | PRN
  Administered 2012-12-17 – 2012-12-18 (×12): 1 mg via INTRAVENOUS
  Filled 2012-12-17 (×12): qty 1

## 2012-12-17 MED ORDER — KETOROLAC TROMETHAMINE 30 MG/ML IJ SOLN
INTRAMUSCULAR | Status: AC
  Filled 2012-12-17: qty 1

## 2012-12-17 MED ORDER — HYDROMORPHONE HCL PF 1 MG/ML IJ SOLN
INTRAMUSCULAR | Status: AC
  Administered 2012-12-17: 1 mg via INTRAVENOUS
  Filled 2012-12-17: qty 1

## 2012-12-17 MED ORDER — ONDANSETRON HCL 4 MG/2ML IJ SOLN
INTRAMUSCULAR | Status: DC | PRN
  Administered 2012-12-17: 4 mg via INTRAVENOUS

## 2012-12-17 MED ORDER — CIPROFLOXACIN IN D5W 400 MG/200ML IV SOLN
400.0000 mg | Freq: Once | INTRAVENOUS | Status: AC
  Administered 2012-12-17: 400 mg via INTRAVENOUS
  Filled 2012-12-17: qty 200

## 2012-12-17 MED ORDER — PROMETHAZINE HCL 25 MG/ML IJ SOLN: 6.2500 mg | INTRAMUSCULAR | Status: DC | PRN

## 2012-12-17 MED ORDER — DEXTROSE-NACL 5-0.9 % IV SOLN
INTRAVENOUS | Status: DC
  Administered 2012-12-17: 16:00:00 via INTRAVENOUS
  Administered 2012-12-17: 1000 mL via INTRAVENOUS
  Administered 2012-12-18: 11:00:00 via INTRAVENOUS

## 2012-12-17 MED ORDER — ONDANSETRON HCL 4 MG PO TABS: 4.0000 mg | ORAL_TABLET | Freq: Four times a day (QID) | ORAL | Status: DC | PRN

## 2012-12-17 MED ORDER — INFLUENZA VAC SPLIT QUAD 0.5 ML IM SUSP
0.5000 mL | INTRAMUSCULAR | Status: DC
  Filled 2012-12-17 (×2): qty 0.5

## 2012-12-17 MED ORDER — PROPOFOL 10 MG/ML IV BOLUS
INTRAVENOUS | Status: DC | PRN
  Administered 2012-12-17: 200 mg via INTRAVENOUS

## 2012-12-17 MED ORDER — SODIUM CHLORIDE 0.9 % IV SOLN
INTRAVENOUS | Status: AC
  Administered 2012-12-17: 04:00:00 via INTRAVENOUS

## 2012-12-17 MED ORDER — DOCUSATE SODIUM 100 MG PO CAPS
100.0000 mg | ORAL_CAPSULE | Freq: Two times a day (BID) | ORAL | Status: DC
  Administered 2012-12-17 – 2012-12-18 (×3): 100 mg via ORAL
  Filled 2012-12-17 (×3): qty 1

## 2012-12-17 MED ORDER — CIPROFLOXACIN IN D5W 400 MG/200ML IV SOLN
400.0000 mg | Freq: Two times a day (BID) | INTRAVENOUS | Status: DC
  Administered 2012-12-17 – 2012-12-18 (×3): 400 mg via INTRAVENOUS
  Filled 2012-12-17 (×4): qty 200

## 2012-12-17 MED ORDER — ONDANSETRON HCL 4 MG/2ML IJ SOLN: 4.0000 mg | Freq: Four times a day (QID) | INTRAMUSCULAR | Status: DC | PRN

## 2012-12-17 MED ORDER — BELLADONNA ALKALOIDS-OPIUM 16.2-60 MG RE SUPP: 1.0000 | Freq: Four times a day (QID) | RECTAL | Status: DC | PRN

## 2012-12-17 MED ORDER — HEPARIN SODIUM (PORCINE) 5000 UNIT/ML IJ SOLN
5000.0000 [IU] | Freq: Three times a day (TID) | INTRAMUSCULAR | Status: DC
  Administered 2012-12-17 – 2012-12-18 (×3): 5000 [IU] via SUBCUTANEOUS
  Filled 2012-12-17 (×7): qty 1

## 2012-12-17 MED ORDER — HYDROMORPHONE HCL PF 1 MG/ML IJ SOLN
1.0000 mg | Freq: Once | INTRAMUSCULAR | Status: AC
  Administered 2012-12-17: 1 mg via INTRAVENOUS
  Filled 2012-12-17: qty 1

## 2012-12-17 MED ORDER — KETOROLAC TROMETHAMINE 30 MG/ML IJ SOLN
15.0000 mg | Freq: Once | INTRAMUSCULAR | Status: AC | PRN
  Administered 2012-12-17: 30 mg via INTRAVENOUS

## 2012-12-17 MED ORDER — KETAMINE HCL 10 MG/ML IJ SOLN
INTRAMUSCULAR | Status: DC | PRN
  Administered 2012-12-17: 10 mg via INTRAVENOUS

## 2012-12-17 MED ORDER — IOHEXOL 300 MG/ML  SOLN
100.0000 mL | Freq: Once | INTRAMUSCULAR | Status: AC | PRN
  Administered 2012-12-17: 100 mL via INTRAVENOUS

## 2012-12-17 MED ORDER — SODIUM CHLORIDE 0.9 % IV SOLN
1.5000 g | Freq: Four times a day (QID) | INTRAVENOUS | Status: DC
  Administered 2012-12-17 – 2012-12-18 (×6): 1.5 g via INTRAVENOUS
  Filled 2012-12-17 (×8): qty 1.5

## 2012-12-17 MED ORDER — SODIUM CHLORIDE 0.9 % IR SOLN
Status: DC | PRN
  Administered 2012-12-17 (×4): 1000 mL

## 2012-12-17 MED ORDER — LACTATED RINGERS IV SOLN
INTRAVENOUS | Status: DC | PRN
  Administered 2012-12-17: 12:00:00 via INTRAVENOUS

## 2012-12-17 MED ORDER — LIDOCAINE HCL (CARDIAC) 20 MG/ML IV SOLN
INTRAVENOUS | Status: DC | PRN
  Administered 2012-12-17: 30 mg via INTRAVENOUS

## 2012-12-17 SURGICAL SUPPLY — 22 items
BAG URINE DRAINAGE (UROLOGICAL SUPPLIES) ×2 IMPLANT
BAG URO CATCHER STRL LF (DRAPE) ×2 IMPLANT
BLADE SURG 15 STRL LF DISP TIS (BLADE) IMPLANT
BLADE SURG 15 STRL SS (BLADE)
CATH FOLEY 3WAY 30CC 22FR (CATHETERS) ×4 IMPLANT
DRAPE CAMERA CLOSED 9X96 (DRAPES) ×2 IMPLANT
ELECT LOOP MED HF 24F 12D CBL (CLIP) ×2 IMPLANT
ELECT REM PT RETURN 9FT ADLT (ELECTROSURGICAL) ×2
ELECTRODE REM PT RTRN 9FT ADLT (ELECTROSURGICAL) ×1 IMPLANT
EVACUATOR MICROVAS BLADDER (UROLOGICAL SUPPLIES) ×2 IMPLANT
GLOVE BIOGEL M 8.0 STRL (GLOVE) ×2 IMPLANT
GOWN PREVENTION PLUS XLARGE (GOWN DISPOSABLE) ×2 IMPLANT
GOWN STRL REIN XL XLG (GOWN DISPOSABLE) ×2 IMPLANT
HOLDER FOLEY CATH W/STRAP (MISCELLANEOUS) ×2 IMPLANT
KIT ASPIRATION TUBING (SET/KITS/TRAYS/PACK) ×2 IMPLANT
LOOPS RESECTOSCOPE DISP (ELECTROSURGICAL) ×2 IMPLANT
MANIFOLD NEPTUNE II (INSTRUMENTS) ×2 IMPLANT
PACK CYSTO (CUSTOM PROCEDURE TRAY) ×2 IMPLANT
SUT ETHILON 3 0 PS 1 (SUTURE) IMPLANT
SYR 30ML LL (SYRINGE) ×2 IMPLANT
SYRINGE IRR TOOMEY STRL 70CC (SYRINGE) ×2 IMPLANT
TUBING CONNECTING 10 (TUBING) ×2 IMPLANT

## 2012-12-17 NOTE — ED Notes (Signed)
Patient transported to CT 

## 2012-12-17 NOTE — Consult Note (Signed)
Urology Consult  CC: Prostatic abscess   Referring M.D.: Conley Rolls  HPI:  37 year old  male is admitted earlier this morning with prostatic and possible left renal abscess. The patient developed fever of 103 and significant perineal pain about 2 weeks ago. Since then this pain has been getting worse, and he has had significant dysuria and pelvic pressure. He presented to [facility] about 4 days ago, and was put on Cipro 500 mg twice daily. A urine culture was not sent. Despite antibiotics, the patient's pelvic and voiding symptoms have gotten worse. They were intolerable, and he presented to the emergency room this morning for evaluation and management.  Evaluation included a CT of the abdomen and pelvis. This revealed a probable left renal abscess located posteriorly, as well as a significant prostatic abscess. The patient did not have significant pyuria. He is admitted for further management.  He denies prior urinary tract infections, or any urologic abnormalities. He has not had any voiding symptoms previously. His last sexual contact was about 3 weeks ago. He is monogamous. He does not use IV drugs. He did have a tattoo of a week or 2 ago.  PMH: Past Medical History  Diagnosis Date  . Pneumonia     PSH: Past Surgical History  Procedure Laterality Date  . Back surgery    . Appendectomy      Allergies: No Known Allergies  Medications: Prescriptions prior to admission  Medication Sig Dispense Refill  . ciprofloxacin (CIPRO) 500 MG tablet Take 1 tablet (500 mg total) by mouth 2 (two) times daily.  28 tablet  0  . docusate sodium (COLACE) 100 MG capsule Take 100 mg by mouth 2 (two) times daily.      Marland Kitchen HYDROcodone-acetaminophen (NORCO/VICODIN) 5-325 MG per tablet Take 1-2 tablets by mouth every 6 (six) hours as needed for moderate pain.  20 tablet  0     Social History: History   Social History  . Marital Status: Single    Spouse Name: N/A    Number of Children: N/A  .  Years of Education: N/A   Occupational History  . Not on file.   Social History Main Topics  . Smoking status: Current Every Day Smoker  . Smokeless tobacco: Not on file  . Alcohol Use: Yes     Comment: occasional  . Drug Use: Yes    Special: Marijuana  . Sexual Activity:    Other Topics Concern  . Not on file   Social History Narrative  . No narrative on file    Family History: History reviewed. No pertinent family history.  Review of Systems: Positive:  dysuria, frequency, feeling of incomplete emptying, perineal/pelvic pain, fever. Negative:   A further 10 point review of systems was negative except what is listed in the HPI.  Physical Exam: @VITALS2 @ General: No acute distress.  Awake. Head:  Normocephalic.  Atraumatic. ENT:  EOMI.  Mucous membranes moist Neck:  Supple.  No lymphadenopathy. CV:  S1 present. S2 present. Regular rate. Pulmonary: Equal effort bilaterally.  Clear to auscultation bilaterally. Abdomen: Soft.   There is minimal left CVA tenderness. There is minimal left lower corner tenderness. No rebound or guarding is noted. Latter is nonpalpable.  Skin:  Normal turgor.  No visible rash. Extremity: No gross deformity of bilateral upper extremities.  No gross deformity of    bilateral lower extremities. Neurologic: Alert. Appropriate mood.  Penis:   circumcised .  No lesions. Urethra:   Orthotopic  meatus. Scrotum: No lesions.  No ecchymosis.  No erythema. Testicles: Descended bilaterally.  No masses bilaterally. Epididymis: Palpable bilaterally.  Non Tender to palpation.  Studies:  Recent Labs     12/16/12  2356  HGB  14.7  WBC  13.0*  PLT  280    Recent Labs     12/16/12  2356  NA  136  K  3.6  CL  102  CO2  26  BUN  13  CREATININE  0.80  CALCIUM  9.8  GFRNONAA  >90  GFRAA  >90     No results found for this basename: PT, INR, APTT,  in the last 72 hours   No components found with this basename: ABG,     I reviewed the  patient's CT scan images independently. There is a left renal abscess as well as a left-sided prostatic abscess with asymmetry of the prostate.  Assessment:   1. Prostatic abscess with significant symptomatology. He does not seem to be in retention. This has been partially treated with Cipro  2. Left renal abscess  Plan:  1. I discussed management with the patient. IV antibiotic management should treat the patient's left renal abscess adequately. However, his prostatic abscess should be unroofed with cystoscopic management.  2. I discussed that procedure with the patient, as well as the need for a catheter for least a day or 2 afterwards.  3. I will see about getting this procedure scheduled in the near future/today.  4. Continue IV antibiotics.    Pager:[phone]

## 2012-12-17 NOTE — Progress Notes (Signed)
Patient ID: Jeffery Cross, male   DOB: Sep 25, 1975, 37 y.o.   MRN: [id number]  Please see earlier admission note by Dr. Conley Rolls. Pt seen and examined at bedside. Continue current ABX. Follow upon urology recommendations. 2 D ECHO pending.  Debbora Presto, MD  [facility] Pager [phone]  If 7PM-7AM, please contact night-coverage [email or web address] Password [email or web address]

## 2012-12-17 NOTE — H&P (Signed)
[facility] History and Physical  PROSPER PAFF ZOX:[id number] DOB: [birth year]    PCP:   No PCP Per Patient   Chief Complaint: pain in the perineum area.  HPI: Jeffery Cross is an 37 y.o. male previously healthy on no chronic medication, felt ill about 2 weeks ago,  Complaining of pain in the perineum area that was severe.  He also had night sweat, and subjective fever.  No dysuria, testicular pain, nausea or vomiting.  He was given Cipro about 4 days ago, and has been compliant with this med.  After feeling ill, he also had a tattoo placed on his left inner arm.  He noted that there was a pustule on his left forearm as well.  There has been no abdominal pain, chest pain, shortness of breath or any HA.  He has no coughs, earaches, sorethroat, or rash.  He denied any increase risk for STD, and has been monogamous heterosexually active.  He uses no IVD.  Evalaution in the ER included a normal WBC, normal renal fx tests.  His UA on 11/11 was positive with 7-10 WBC and leuk esterace positive.  His GC/Chlymidia geneprobe was negative.  Tonight, a CT of the abd/ pelvic showed fluid collection on the left prostate, suspicious for an abcess.  It also showed a possible left renal abcess as well.  He was given IV Cipro in the ER, and urologist was consulted.  Hospitalist was asked to admit him for further evalaution and tx.  Rewiew of Systems:  Constitutional: Negative for malaise No significant weight loss or weight gain Eyes: Negative for eye pain, redness and discharge, diplopia, visual changes, or flashes of light. ENMT: Negative for ear pain, hoarseness, nasal congestion, sinus pressure and sore throat. No headaches; tinnitus, drooling, or problem swallowing. Cardiovascular: Negative for chest pain, palpitations, diaphoresis, dyspnea and peripheral edema. ; No orthopnea, PND Respiratory: Negative for cough, hemoptysis, wheezing and stridor. No pleuritic chestpain. Gastrointestinal: Negative  for nausea, vomiting, diarrhea, constipation, abdominal pain, melena, blood in stool, hematemesis, jaundice and rectal bleeding.    Genitourinary: Negative for frequency, dysuria, incontinence,flank pain and hematuria; Musculoskeletal: Negative for back pain and neck pain. Negative for swelling and trauma.;  Skin: . Negative for pruritus, rash, abrasions, bruising and skin lesion.; ulcerations Neuro: Negative for headache, lightheadedness and neck stiffness. Negative for weakness, altered level of consciousness , altered mental status, extremity weakness, burning feet, involuntary movement, seizure and syncope.  Psych: negative for anxiety, depression, insomnia, tearfulness, panic attacks, hallucinations, paranoia, suicidal or homicidal ideation.   Past Medical History  Diagnosis Date  . Pneumonia     Past Surgical History  Procedure Laterality Date  . Back surgery    . Appendectomy      Medications:  HOME MEDS: Prior to Admission medications   Medication Sig Start Date End Date Taking? Authorizing Provider  ciprofloxacin (CIPRO) 500 MG tablet Take 1 tablet (500 mg total) by mouth 2 (two) times daily. 12/13/12  Yes John L Molpus, MD  docusate sodium (COLACE) 100 MG capsule Take 100 mg by mouth 2 (two) times daily.   Yes Historical Provider, MD  HYDROcodone-acetaminophen (NORCO/VICODIN) 5-325 MG per tablet Take 1-2 tablets by mouth every 6 (six) hours as needed for moderate pain. 12/13/12  Yes John Elbert Ewings Molpus, MD     Allergies:  No Known Allergies  Social History:   reports that he has been smoking.  He does not have any smokeless tobacco history on file. He reports that he  drinks alcohol. He reports that he uses illicit drugs (Marijuana).  Family History: History reviewed. No pertinent family history.   Physical Exam: Filed Vitals:   12/17/12 0015 12/17/12 0133 12/17/12 0200 12/17/12 0230  BP:  114/61    Pulse: 88 75 66 81  Temp:  97.7 F (36.5 C)    TempSrc:  Oral     Resp:  18    SpO2: 96% 100% 99% 99%   Blood pressure 114/61, pulse 81, temperature 97.7 F (36.5 C), temperature source Oral, resp. rate 18, SpO2 99.00%.  GEN:  Pleasant patient lying in the stretcher in no acute distress; cooperative with exam. PSYCH:  alert and oriented x4; does not appear anxious or depressed; affect is appropriate. HEENT: Mucous membranes pink and anicteric; PERRLA; EOM intact; no cervical lymphadenopathy nor thyromegaly or carotid bruit; no JVD; There were no stridor. Neck is very supple. Breasts:: Not examined CHEST WALL: No tenderness CHEST: Normal respiration, clear to auscultation bilaterally.  HEART: Regular rate and rhythm.  There are no murmur, rub, or gallops.   BACK: No kyphosis or scoliosis; no CVA tenderness ABDOMEN: soft and non-tender; no masses, no organomegaly, normal abdominal bowel sounds; no pannus; no intertriginous candida. There is no rebound and no distention. Rectal Exam: Not done EXTREMITIES: No bone or joint deformity; age-appropriate arthropathy of the hands and knees; no edema; no ulcerations.  There is no calf tenderness. Genitalia: not examined PULSES: 2+ and symmetric SKIN: Normal hydration no rash or ulceration.  At the site of the tattoo, there is no evidence of infection. There is a folliculitis on his left forearm.  He has no left axillary lymphadenopathy. CNS: Cranial nerves 2-12 grossly intact no focal lateralizing neurologic deficit.  Speech is fluent; uvula elevated with phonation, facial symmetry and tongue midline. DTR are normal bilaterally, cerebella exam is intact, barbinski is negative and strengths are equaled bilaterally.  No sensory loss.   Labs on Admission:  Basic Metabolic Panel:  Recent Labs Lab 12/16/12 2356  NA 136  K 3.6  CL 102  CO2 26  GLUCOSE 100*  BUN 13  CREATININE 0.80  CALCIUM 9.8   Liver Function Tests: No results found for this basename: AST, ALT, ALKPHOS, BILITOT, PROT, ALBUMIN,  in the last  168 hours No results found for this basename: LIPASE, AMYLASE,  in the last 168 hours No results found for this basename: AMMONIA,  in the last 168 hours CBC:  Recent Labs Lab 12/16/12 2356  WBC 13.0*  NEUTROABS 9.5*  HGB 14.7  HCT 41.1  MCV 93.8  PLT 280   Cardiac Enzymes: No results found for this basename: CKTOTAL, CKMB, CKMBINDEX, TROPONINI,  in the last 168 hours  CBG: No results found for this basename: GLUCAP,  in the last 168 hours   Radiological Exams on Admission: Ct Abdomen Pelvis W Contrast  12/17/2012   CLINICAL DATA:  Rectal pressure and pain.  Prostate tenderness.  EXAM: CT ABDOMEN AND PELVIS WITH CONTRAST  TECHNIQUE: Multidetector CT imaging of the abdomen and pelvis was performed using the standard protocol following bolus administration of intravenous contrast.  CONTRAST:  OMNIPAQUE IOHEXOL 300 MG/ML  SOLN  COMPARISON:  MRI of the lumbar spine performed 05/02/2007  FINDINGS: The visualized lung bases are clear.  The liver and spleen are unremarkable in appearance. The gallbladder is within normal limits. The pancreas and adrenal glands are unremarkable.  There is a 2.7 x 2.5 cm rounded soft tissue density mass at the interpole region  of the left kidney, with surrounding edema. This contains a focus of fluid and demonstrates mild peripheral enhancement, suspicious for a focal abscess. It may reflect an atypical infectious process, given its unusual appearance. The kidneys are otherwise unremarkable in appearance. Mild left-sided perinephric stranding is seen. There is no evidence of hydronephrosis. No renal ureteral stones are identified.  No free fluid is identified. The small bowel is unremarkable in appearance. The stomach is within normal limits. No acute vascular abnormalities are seen.  The patient is status post appendectomy. The colon is unremarkable in appearance.  The bladder is mildly distended and grossly unremarkable in appearance. Note is made of a  prominent 3.6 x 3.6 cm multiloculated peripherally enhancing fluid collection filling the left side of the prostate gland, concerning for focal prostate abscess. Given the patient's age, malignancy is thought to be unlikely. This may reflect an atypical infectious process. No inguinal lymphadenopathy is seen.  No acute osseous abnormalities are identified.  IMPRESSION: 1. Prominent 3.6 x 3.6 cm multiloculated peripherally enhancing fluid collection filling the left side of the prostate gland, concerning for focal prostate abscess. Given the patient's age, malignancy is thought to be unlikely. This may reflect an atypical infectious process. 2. 2.7 x 2.5 cm rounded soft tissue density mass at the interpole region of the left kidney, with surrounding edema. This also contains a focus of fluid and demonstrates mild peripheral enhancement, suspicious for a focal renal abscess. Mild associated soft tissue inflammation seen.  These results were called by telephone at the time of interpretation on 12/17/2012 at 1:54 AM to Dr. Azalia Bilis , who verbally acknowledged these results.   Electronically Signed   By: Roanna Raider M.D.   On: 12/17/2012 01:55     Assessment/Plan Present on Admission:  . Prostatic abscess . Renal abscess . Tobacco abuse . Folliculitis  PLAN: Will admit him for multiple focal abscesses.  Urologist Dr. Retta Diones was consulted by EDP, recommended continuing IV Cipro, and made NPO for likely surgical intervention in the OR in the am.  With multiple abscesses, I am concerned about endocarditis.  Will get a set of blood culture, and obtain a cardiac ECHO.  Also, I would like to add Unasyn IV to cover potential enterococcus and a little better aenarobic coverage.  Both his blood and urine were sent for culture.  Will keep him NPO, give IVF, and IV analgesics.  He is stable, full code, and will be admitted to [facility] service.  Thank you for allowing me to participate in his care.  Code Status:  FULL Unk Lightning, MD. [facility] Pager [phone] 7pm to 7am.  12/17/2012, 3:00 AM

## 2012-12-17 NOTE — Anesthesia Preprocedure Evaluation (Signed)
Anesthesia Evaluation  Patient identified by MRN, date of birth, ID band Patient awake    Reviewed: Allergy & Precautions, H&P , NPO status , Patient's Chart, lab work & pertinent test results  Airway Mallampati: II TM Distance: <3 FB Neck ROM: Full    Dental no notable dental hx.    Pulmonary neg pulmonary ROS, Current Smoker,  breath sounds clear to auscultation  Pulmonary exam normal       Cardiovascular negative cardio ROS  Rhythm:Regular Rate:Normal     Neuro/Psych negative neurological ROS  negative psych ROS   GI/Hepatic negative GI ROS, Neg liver ROS,   Endo/Other  negative endocrine ROS  Renal/GU negative Renal ROS  negative genitourinary   Musculoskeletal negative musculoskeletal ROS (+)   Abdominal   Peds negative pediatric ROS (+)  Hematology negative hematology ROS (+)   Anesthesia Other Findings   Reproductive/Obstetrics negative OB ROS                           Anesthesia Physical Anesthesia Plan  ASA: II  Anesthesia Plan: General   Post-op Pain Management:    Induction: Intravenous  Airway Management Planned: LMA  Additional Equipment:   Intra-op Plan:   Post-operative Plan:   Informed Consent: I have reviewed the patients History and Physical, chart, labs and discussed the procedure including the risks, benefits and alternatives for the proposed anesthesia with the patient or authorized representative who has indicated his/her understanding and acceptance.   Dental advisory given  Plan Discussed with: CRNA and Surgeon  Anesthesia Plan Comments:         Anesthesia Quick Evaluation

## 2012-12-17 NOTE — ED Notes (Signed)
Dr. Campos at bedside   

## 2012-12-17 NOTE — ED Provider Notes (Signed)
CSN: [id number]     Arrival date & time 12/16/12  2251 History   First MD Initiated Contact with Patient 12/16/12 2311     Chief complaint: Rectal pain  HPI Patient presents with ongoing worsening rectal pain and pressure when he urinates.  He does have some dysuria.  He was seen approximately 3-1/2 days ago and was diagnosed with prostatitis.  He was placed on ciprofloxacin at that time.  He states worsening pain.  He's had chills without documented fever.  He denies nausea and vomiting.  No recent weight changes.  He denies IV drug abuse.  He denies anal intercourse.  His symptoms are severe.  Nothing worsens or improves his pain.    Past Medical History  Diagnosis Date  . Pneumonia    Past Surgical History  Procedure Laterality Date  . Back surgery    . Appendectomy     History reviewed. No pertinent family history. History  Substance Use Topics  . Smoking status: Current Every Day Smoker  . Smokeless tobacco: Not on file  . Alcohol Use: Yes     Comment: occasional    Review of Systems  All other systems reviewed and are negative.    Allergies  Review of patient's allergies indicates no known allergies.  Home Medications   Current Outpatient Rx  Name  Route  Sig  Dispense  Refill  . ciprofloxacin (CIPRO) 500 MG tablet   Oral   Take 1 tablet (500 mg total) by mouth 2 (two) times daily.   28 tablet   0   . docusate sodium (COLACE) 100 MG capsule   Oral   Take 100 mg by mouth 2 (two) times daily.         Marland Kitchen HYDROcodone-acetaminophen (NORCO/VICODIN) 5-325 MG per tablet   Oral   Take 1-2 tablets by mouth every 6 (six) hours as needed for moderate pain.   20 tablet   0    BP 114/61  Pulse 66  Temp(Src) 97.7 F (36.5 C) (Oral)  Resp 18  SpO2 99% Physical Exam  Nursing note and vitals reviewed. Constitutional: He is oriented to person, place, and time. He appears well-developed and well-nourished.  HENT:  Head: Normocephalic and atraumatic.  Eyes: EOM  are normal.  Neck: Normal range of motion.  Cardiovascular: Normal rate, regular rhythm, normal heart sounds and intact distal pulses.   Pulmonary/Chest: Effort normal and breath sounds normal. No respiratory distress.  Abdominal: Soft. He exhibits no distension. There is no tenderness.  Genitourinary:  Circumcised penis.  No penile tenderness.  Normal testicles.  No testicular tenderness or mass.  Significant prostate tenderness with some irregularity.  Musculoskeletal: Normal range of motion.  Neurological: He is alert and oriented to person, place, and time.  Skin: Skin is warm and dry.  Psychiatric: He has a normal mood and affect. Judgment normal.    ED Course  Procedures (including critical care time) Labs Review Labs Reviewed  URINALYSIS, ROUTINE W REFLEX MICROSCOPIC - Abnormal; Notable for the following:    APPearance CLOUDY (*)    Specific Gravity, Urine 1.032 (*)    Bilirubin Urine SMALL (*)    Leukocytes, UA SMALL (*)    All other components within normal limits  CBC WITH DIFFERENTIAL - Abnormal; Notable for the following:    WBC 13.0 (*)    Neutro Abs 9.5 (*)    All other components within normal limits  BASIC METABOLIC PANEL - Abnormal; Notable for the following:  Glucose, Bld 100 (*)    All other components within normal limits  URINE MICROSCOPIC-ADD ON - Abnormal; Notable for the following:    Bacteria, UA FEW (*)    All other components within normal limits  URINE CULTURE  CULTURE, BLOOD (ROUTINE X 2)  CULTURE, BLOOD (ROUTINE X 2)   Imaging Review Ct Abdomen Pelvis W Contrast  12/17/2012   CLINICAL DATA:  Rectal pressure and pain.  Prostate tenderness.  EXAM: CT ABDOMEN AND PELVIS WITH CONTRAST  TECHNIQUE: Multidetector CT imaging of the abdomen and pelvis was performed using the standard protocol following bolus administration of intravenous contrast.  CONTRAST:  OMNIPAQUE IOHEXOL 300 MG/ML  SOLN  COMPARISON:  MRI of the lumbar spine performed  05/02/2007  FINDINGS: The visualized lung bases are clear.  The liver and spleen are unremarkable in appearance. The gallbladder is within normal limits. The pancreas and adrenal glands are unremarkable.  There is a 2.7 x 2.5 cm rounded soft tissue density mass at the interpole region of the left kidney, with surrounding edema. This contains a focus of fluid and demonstrates mild peripheral enhancement, suspicious for a focal abscess. It may reflect an atypical infectious process, given its unusual appearance. The kidneys are otherwise unremarkable in appearance. Mild left-sided perinephric stranding is seen. There is no evidence of hydronephrosis. No renal ureteral stones are identified.  No free fluid is identified. The small bowel is unremarkable in appearance. The stomach is within normal limits. No acute vascular abnormalities are seen.  The patient is status post appendectomy. The colon is unremarkable in appearance.  The bladder is mildly distended and grossly unremarkable in appearance. Note is made of a prominent 3.6 x 3.6 cm multiloculated peripherally enhancing fluid collection filling the left side of the prostate gland, concerning for focal prostate abscess. Given the patient's age, malignancy is thought to be unlikely. This may reflect an atypical infectious process. No inguinal lymphadenopathy is seen.  No acute osseous abnormalities are identified.  IMPRESSION: 1. Prominent 3.6 x 3.6 cm multiloculated peripherally enhancing fluid collection filling the left side of the prostate gland, concerning for focal prostate abscess. Given the patient's age, malignancy is thought to be unlikely. This may reflect an atypical infectious process. 2. 2.7 x 2.5 cm rounded soft tissue density mass at the interpole region of the left kidney, with surrounding edema. This also contains a focus of fluid and demonstrates mild peripheral enhancement, suspicious for a focal renal abscess. Mild associated soft tissue  inflammation seen.  These results were called by telephone at the time of interpretation on 12/17/2012 at 1:54 AM to Dr. Azalia Bilis , who verbally acknowledged these results.   Electronically Signed   By: Roanna Raider M.D.   On: 12/17/2012 01:55  I personally reviewed the imaging tests through PACS system I reviewed available ER/hospitalization records through the EMR   EKG Interpretation   None       MDM   1. Prostatic abscess   2. Renal abscess    Patient presents with CT scan concerning for prosthetic abscess.  Questionable left renal abscess.  Patient will be admitted to the hospital with IV antibiotics.  I discussed his case with the urologist Dr. Retta Diones who requested the patient be made n.p.o.  He suspects that he will need to unroof the abscess tomorrow in the operating room.  He will see the patient in the morning time.  He recommends continuing with ciprofloxacin at this time.  Urine culture sent.  Hospitalist  to admit.    Lyanne Co, MD 12/17/12 [phone]

## 2012-12-17 NOTE — Op Note (Signed)
Preoperative diagnosis: Prostatic abscess  Postoperative diagnosis: Same   Procedure: Cystoscopy, transurethral unroofing of prostatic abscess    Surgeon: Bertram Millard. Deslyn Cavenaugh, M.D.   Anesthesia: Gen.   Complications: None  Specimen(s): None  Drain(s): 22 French three-way Foley catheter to continuous bladder irrigation with normal saline  Indications: 37 year old male admitted early this morning with a renal and prostatic abscess. He has significant symptomatology from his prostatic abscess. This prostatic abscess is evident on CT the abdomen and pelvis, which also revealed a fair sized left renal abscess. He presents at this time for cystoscopy and TUR unroofing of his prostatic abscess in order to relieve his symptoms and speed the healing process. The procedure as well as risks and complications have been discussed with the patient earlier this morning. He understands these and desires to proceed.    Technique and findings: The patient was properly identified in the holding area. He had received IV antibiotics already during this hospitalization. He was taken to the operating room where general anesthetic was administered with the LMA. He was placed in the dorsolithotomy position. Genitalia and perineum were prepped and draped. Proper timeout was then performed.  I dilated his urethral meatus to 28 [place] with [employer] sounds. Cystoscopy was then performed. This revealed a normal urethra without stricture. Prostatic urethra was normal in length. There was some asymmetry of the prostate with bulging of the left lateral lobe. There was no evident obstruction of the prostate. The bladder was entered and inspected circumferentially. There were no tumors trabeculations or foreign bodies. Ureteral orifices were normal in configuration and location. I then removed the cystoscope and placed a 27 French resectoscope sheath using the [place] obturator. The resectoscope element and cutting loop were  then placed. I then incised the left lateral lobe in a couple of locations, and with the second pass through the prostatic lobe with the loop, there was copious drainage of pus into the operative field. I then unroofed this abscess with a couple of more passes of the loop. A significant amount of purulent matter drained. The abscess cavity was somewhat erythematous and didn't bleed. The edges of the resected abscess were carefully controlled with electrocautery. At this point, all visible bleeding was controlled. There was still a small amount of slight ooze that I thought would easily stop with observation and irrigation. No significant chips were irrigated from the bladder. Inspection revealed fair hemostasis. At this point the scope was removed and a 22 [place] three-way Foley catheter was placed without difficulty and the bladder. The balloon was filled with 30 cc of water. This was then hooked to CBI.  The patient tolerated procedure well. He was awakened and taken to PACU in stable condition.

## 2012-12-17 NOTE — Transfer of Care (Signed)
Immediate Anesthesia Transfer of Care Note  Patient: Jeffery Cross  Procedure(s) Performed: Procedure(s): TRANSURETHRAL RESECTION OF THE PROSTATE (TURP) - Unroofing of prostatic abscess (N/A)  Patient Location: PACU  Anesthesia Type:General  Level of Consciousness: sedated  Airway & Oxygen Therapy: Patient Spontanous Breathing and Patient connected to face mask oxygen  Post-op Assessment: Report given to PACU RN and Post -op Vital signs reviewed and stable  Post vital signs: stable  Complications: No apparent anesthesia complications

## 2012-12-17 NOTE — Progress Notes (Signed)
Report called to urology nurse from 4th floor.

## 2012-12-17 NOTE — Progress Notes (Signed)
HELD am Heparin as pt will possibly be scheduled for OR this am.per orders MD note.

## 2012-12-17 NOTE — Progress Notes (Signed)
Pt transported via bed to room 1434 by RN student and NT.

## 2012-12-18 DIAGNOSIS — I059 Rheumatic mitral valve disease, unspecified: Secondary | ICD-10-CM

## 2012-12-18 LAB — URINE CULTURE

## 2012-12-18 MED ORDER — PHENAZOPYRIDINE HCL 200 MG PO TABS
200.0000 mg | ORAL_TABLET | Freq: Three times a day (TID) | ORAL | Status: DC
  Administered 2012-12-18 (×2): 200 mg via ORAL
  Filled 2012-12-18 (×3): qty 1

## 2012-12-18 MED ORDER — PHENAZOPYRIDINE HCL 95 MG PO TABS: 95.0000 mg | ORAL_TABLET | Freq: Three times a day (TID) | ORAL | Status: DC | PRN

## 2012-12-18 MED ORDER — HYDROCODONE-ACETAMINOPHEN 5-325 MG PO TABS
1.0000 | ORAL_TABLET | ORAL | Status: AC | PRN
Start: 2012-12-18 — End: 2012-12-18
  Administered 2012-12-18: 2 via ORAL
  Filled 2012-12-18: qty 2

## 2012-12-18 NOTE — Progress Notes (Signed)
Echocardiogram 2D Echocardiogram has been performed.  Jeffery Cross 12/18/2012, 10:02 AM

## 2012-12-18 NOTE — Progress Notes (Signed)
Patient ID: Jeffery Cross, male   DOB: August 24, 1975, 37 y.o.   MRN: [id number] [facility] PROGRESS NOTE  Jeffery Cross ZHY:[id number] DOB: 1975-11-23 DOA: 12/16/2012 PCP: No PCP Per Patient  Brief narrative: 37 y.o. male with polysubstance abuse including cocaine, THC, tobacco, amphetamines presented to [facility] ED with main concern of 2 weeks duration persistent, throbbing perineal area pain, radiating to lower abdominal quadrants, associated with fevers, chills, malaise, night sweats. He was prescribed Ciprofloxacin and has been on it for 4 days but his symptoms have not improved. Pt denies chest pain or shortness of breath, no similar events in the past. He also denied recent sick contacts or exposures. In ED, CT of the abd/ pelvic showed fluid collection on the prostate, suspicious for an abcess. It also showed a possible left renal abcess as well. He was given IV Cipro in the ER, and urologist was consulted. Hospitalist was asked to admit him for further evalaution and tx.  Principal Problem:   Prostatic abscess - appreciate urology following  - pt is now status post Cystoscopy, transurethral unroofing of prostatic abscess, post op day #1  - continue current ABX regimen - follow up on blood cultures  Active Problems:   Renal abscess - ABX Unasyn and Ciprofloxacin day #2   Polysubstance abuse - UDS positive for amphetamines, opiates, THC, cocaine - will need work up for endocarditis as well  - 2 D ECHO pending   Consultants:  Urology   Procedures/Studies: Dg Chest 2 View    12/17/2012    No active cardiopulmonary disease.    Ct Abdomen Pelvis W Contrast   12/17/2012  Prominent 3.6 x 3.6 cm multiloculated peripherally enhancing fluid collection filling the left side of the prostate gland, concerning for focal prostate abscess. Given the patient's age, malignancy is thought to be unlikely. This may reflect an atypical infectious process. 2. 2.7 x 2.5 cm rounded soft tissue  density mass at the interpole region of the left kidney, with surrounding edema. This also contains a focus of fluid and demonstrates mild peripheral enhancement, suspicious for a focal renal abscess. Mild associated soft tissue inflammation seen.  Antibiotics:  Unasyn 11/15 -->  Ciprofloxacin 11/15 -->  Code Status: Full Family Communication: Pt at bedside Disposition Plan: Home when medically stable  HPI/Subjective: No events overnight.   Objective: Filed Vitals:   12/17/12 1442 12/17/12 1600 12/17/12 2204 12/18/12 0530  BP: 118/76 115/70 106/59 110/61  Pulse: 55 64 62 72  Temp: 98.2 F (36.8 C) 98.1 F (36.7 C) 98.2 F (36.8 C) 98 F (36.7 C)  TempSrc:  Oral Oral Oral  Resp: 14 18 16 20   Height:      Weight:      SpO2: 100% 100% 100% 99%    Intake/Output Summary (Last 24 hours) at 12/18/12 0645 Last data filed at 12/18/12 0523  Gross per 24 hour  Intake  29528 ml  Output  13470 ml  Net    -60 ml    Exam:   General:  Pt is alert, follows commands appropriately, not in acute distress  Cardiovascular: Regular rate and rhythm, S1/S2, no murmurs, no rubs, no gallops  Respiratory: Clear to auscultation bilaterally, no wheezing, no crackles, no rhonchi  Abdomen: Soft, non tender, non distended, bowel sounds present, no guarding  Extremities: No edema, pulses DP and PT palpable bilaterally  Neuro: Grossly nonfocal  Data Reviewed: Basic Metabolic Panel:  Recent Labs Lab 12/16/12 2356  NA 136  K 3.6  CL 102  CO2 26  GLUCOSE 100*  BUN 13  CREATININE 0.80  CALCIUM 9.8   CBC:  Recent Labs Lab 12/16/12 2356  WBC 13.0*  NEUTROABS 9.5*  HGB 14.7  HCT 41.1  MCV 93.8  PLT 280   Recent Results (from the past 240 hour(s))  GC/CHLAMYDIA PROBE AMP     Status: None   Collection Time    12/13/12  5:56 AM      Result Value Range Status   CT Probe RNA NEGATIVE  NEGATIVE Final   GC Probe RNA NEGATIVE  NEGATIVE Final   Comment: (NOTE)                                                                                               Normal Reference Range: Negative          Assay performed using the Gen-Probe APTIMA COMBO2 (R) Assay.     Acceptable specimen types for this assay include APTIMA Swabs (Unisex,     endocervical, urethral, or vaginal), first void urine, and ThinPrep     liquid based cytology samples.     Performed at [facility]  SURGICAL PCR SCREEN     Status: None   Collection Time    12/17/12 11:48 AM      Result Value Range Status   MRSA, PCR NEGATIVE  NEGATIVE Final   Staphylococcus aureus NEGATIVE  NEGATIVE Final   Comment:            The Xpert SA Assay (FDA     approved for NASAL specimens     in patients over 77 years of age),     is one component of     a comprehensive surveillance     program.  Test performance has     been validated by [employer] for patients greater     than or equal to 72 year old.     It is not intended     to diagnose infection nor to     guide or monitor treatment.     Scheduled Meds: . ampicillin-sulbactam (UNASYN) IV  1.5 g Intravenous Q6H  . ciprofloxacin  400 mg Intravenous Q12H  . docusate sodium  100 mg Oral BID  . heparin  5,000 Units Subcutaneous Q8H  . influenza vac split quadrivalent PF  0.5 mL Intramuscular Tomorrow-1000   Continuous Infusions: . dextrose 5 % and 0.9% NaCl 1,000 mL (12/17/12 2325)    Jeffery Presto, MD  [facility] Pager [phone]  If 7PM-7AM, please contact night-coverage [email or web address] Password [email or web address] 12/18/2012, 6:45 AM   LOS: 2 days

## 2012-12-18 NOTE — Progress Notes (Signed)
1 Day Post-Op Subjective: Patient reports that he feels much better. Pain is under control.  Objective: Vital signs in last 24 hours: Temp:  [97.8 F (36.6 C)-98.4 F (36.9 C)] 98 F (36.7 C) (11/16 0530) Pulse Rate:  [55-73] 72 (11/16 0530) Resp:  [8-20] 20 (11/16 0530) BP: (106-127)/(59-83) 110/61 mmHg (11/16 0530) SpO2:  [99 %-100 %] 99 % (11/16 0530)  Intake/Output from previous day: 11/15 0701 - 11/16 0700 In: 40981 [P.O.:240; I.V.:750; IV Piggyback:550] Out: 19147 [WGNFA:21308; Blood:5] Intake/Output this shift:    Physical Exam:  Constitutional: Vital signs reviewed. WD WN in NAD   Eyes: PERRL, No scleral icterus.   Pulmonary/Chest: Normal effort Abdominal: Soft. Non-tender, non-distended, bowel sounds are normal, no masses, organomegaly, or guarding present.  Genitourinary: Catheter present at meatus. Slight purulent drainage around the catheter.   Lab Results:  Recent Labs  12/16/12 2356  HGB 14.7  HCT 41.1   BMET  Recent Labs  12/16/12 2356  NA 136  K 3.6  CL 102  CO2 26  GLUCOSE 100*  BUN 13  CREATININE 0.80  CALCIUM 9.8   No results found for this basename: LABPT, INR,  in the last 72 hours No results found for this basename: LABURIN,  in the last 72 hours Results for orders placed during the hospital encounter of 12/16/12  SURGICAL PCR SCREEN     Status: None   Collection Time    12/17/12 11:48 AM      Result Value Range Status   MRSA, PCR NEGATIVE  NEGATIVE Final   Staphylococcus aureus NEGATIVE  NEGATIVE Final   Comment:            The Xpert SA Assay (FDA     approved for NASAL specimens     in patients over 89 years of age),     is one component of     a comprehensive surveillance     program.  Test performance has     been validated by [employer] for patients greater     than or equal to 61 year old.     It is not intended     to diagnose infection nor to     guide or monitor treatment.    Studies/Results: Dg Chest 2  View  12/17/2012   CLINICAL DATA:  Abdominal abscess, pneumonia  EXAM: CHEST  2 VIEW  COMPARISON:  04/01/2011  FINDINGS: The heart size and mediastinal contours are within normal limits. Both lungs are clear. The visualized skeletal structures are unremarkable.  IMPRESSION: No active cardiopulmonary disease.   Electronically Signed   By: Ruel Favors M.D.   On: 12/17/2012 09:09   Ct Abdomen Pelvis W Contrast  12/17/2012   CLINICAL DATA:  Rectal pressure and pain.  Prostate tenderness.  EXAM: CT ABDOMEN AND PELVIS WITH CONTRAST  TECHNIQUE: Multidetector CT imaging of the abdomen and pelvis was performed using the standard protocol following bolus administration of intravenous contrast.  CONTRAST:  OMNIPAQUE IOHEXOL 300 MG/ML  SOLN  COMPARISON:  MRI of the lumbar spine performed 05/02/2007  FINDINGS: The visualized lung bases are clear.  The liver and spleen are unremarkable in appearance. The gallbladder is within normal limits. The pancreas and adrenal glands are unremarkable.  There is a 2.7 x 2.5 cm rounded soft tissue density mass at the interpole region of the left kidney, with surrounding edema. This contains a focus of fluid and demonstrates mild peripheral enhancement, suspicious for a  focal abscess. It may reflect an atypical infectious process, given its unusual appearance. The kidneys are otherwise unremarkable in appearance. Mild left-sided perinephric stranding is seen. There is no evidence of hydronephrosis. No renal ureteral stones are identified.  No free fluid is identified. The small bowel is unremarkable in appearance. The stomach is within normal limits. No acute vascular abnormalities are seen.  The patient is status post appendectomy. The colon is unremarkable in appearance.  The bladder is mildly distended and grossly unremarkable in appearance. Note is made of a prominent 3.6 x 3.6 cm multiloculated peripherally enhancing fluid collection filling the left side of the prostate  gland, concerning for focal prostate abscess. Given the patient's age, malignancy is thought to be unlikely. This may reflect an atypical infectious process. No inguinal lymphadenopathy is seen.  No acute osseous abnormalities are identified.  IMPRESSION: 1. Prominent 3.6 x 3.6 cm multiloculated peripherally enhancing fluid collection filling the left side of the prostate gland, concerning for focal prostate abscess. Given the patient's age, malignancy is thought to be unlikely. This may reflect an atypical infectious process. 2. 2.7 x 2.5 cm rounded soft tissue density mass at the interpole region of the left kidney, with surrounding edema. This also contains a focus of fluid and demonstrates mild peripheral enhancement, suspicious for a focal renal abscess. Mild associated soft tissue inflammation seen.  These results were called by telephone at the time of interpretation on 12/17/2012 at 1:54 AM to Dr. Azalia Bilis , who verbally acknowledged these results.   Electronically Signed   By: Roanna Raider M.D.   On: 12/17/2012 01:55    Assessment/Plan:   Postoperative day #1 unroofing of prostatic abscess. The patient is doing well. He is afebrile. His catheter was removed.    I think it okay to let the patient go if he voids adequately. I have removed the catheter and he will collect 2-3 urine specimens. If he voids okay, he should go home on 10 days of Cipro (he has these at home) as well as Pyridium. I would like to see him in a couple of weeks. He will need followup renal ultrasound and urinalysis.   LOS: 2 days   Marcine Matar M 12/18/2012, 9:04 AM

## 2012-12-19 ENCOUNTER — Encounter (HOSPITAL_COMMUNITY): Payer: Self-pay | Admitting: Urology

## 2012-12-19 MED ORDER — OXYCODONE-ACETAMINOPHEN 7.5-325 MG PO TABS: 1.0000 | ORAL_TABLET | ORAL | Status: DC | PRN

## 2012-12-19 NOTE — Discharge Summary (Signed)
Physician Discharge Summary  Jeffery Cross ZOX:[id number] DOB: 02/07/1975 DOA: 12/16/2012  PCP: No PCP Per Patient  Admit date: 12/16/2012 Discharge date: 12/18/2012  Recommendations for Outpatient Follow-up:  1. Pt will need to follow up with PCP in 2-3 weeks post discharge 2. Please obtain BMP to evaluate electrolytes and kidney function 3. Please also check CBC to evaluate Hg and Hct levels 4. Pt advised to follow up with urologist 5. Pt discharged on 10 days of Ciprofloxacin as recommended by urologist  6. Pt advised to [facility] up with PCP or [facility], information provided    Discharge Diagnoses: Prostatic abscess  Principal Problem:   Prostatic abscess Active Problems:   Renal abscess   Tobacco abuse   Folliculitis  Discharge Condition: Stable  Diet recommendation: Heart healthy diet discussed in details   Brief narrative:  37 y.o. male with polysubstance abuse including cocaine, THC, tobacco, amphetamines presented to [facility] ED with main concern of 2 weeks duration persistent, throbbing perineal area pain, radiating to lower abdominal quadrants, associated with fevers, chills, malaise, night sweats. He was prescribed Ciprofloxacin and has been on it for 4 days but his symptoms have not improved. Pt denies chest pain or shortness of breath, no similar events in the past. He also denied recent sick contacts or exposures. In ED, CT of the abd/ pelvic showed fluid collection on the prostate, suspicious for an abcess. It also showed a possible left renal abcess as well. He was given IV Cipro in the ER, and urologist was consulted. Hospitalist was asked to admit him for further evalaution and tx.   Principal Problem:  Prostatic abscess  - appreciate urology following  - pt is now status post Cystoscopy, transurethral unroofing of prostatic abscess, post op day #1 - continue current ABX regimen  - follow up on blood cultures  - pt wants to go home and  urologist cleared for discharge  Active Problems:  Renal abscess  - ABX Unasyn and Ciprofloxacin day #2  - will need ciprofloxacin for 10 more days post discharge  Polysubstance abuse  - UDS positive for amphetamines, opiates, THC, cocaine  - will need work up for endocarditis as well  - 2 D ECHO pending upon discharge and pt made aware of need for follow up with PCP  Consultants:  Urology  Procedures/Studies:  Dg Chest 2 View 12/17/2012  No active cardiopulmonary disease.  Ct Abdomen Pelvis W Contrast 12/17/2012  Prominent 3.6 x 3.6 cm multiloculated peripherally enhancing fluid collection filling the left side of the prostate gland, concerning for focal prostate abscess. Given the patient's age, malignancy is thought to be unlikely. This may reflect an atypical infectious process. 2. 2.7 x 2.5 cm rounded soft tissue density mass at the interpole region of the left kidney, with surrounding edema. This also contains a focus of fluid and demonstrates mild peripheral enhancement, suspicious for a focal renal abscess. Mild associated soft tissue inflammation seen.  Antibiotics:  Unasyn 11/15 --> 12/08/2012 Ciprofloxacin 11/15 --> 10 more days post discharge   Code Status: Full  Family Communication: Pt at bedside   Discharge Exam: Filed Vitals:   12/18/12 1300  BP: 110/64  Pulse: 74  Temp: 98 F (36.7 C)  Resp: 16   Filed Vitals:   12/17/12 1600 12/17/12 2204 12/18/12 0530 12/18/12 1300  BP: 115/70 106/59 110/61 110/64  Pulse: 64 62 72 74  Temp: 98.1 F (36.7 C) 98.2 F (36.8 C) 98 F (36.7 C) 98 F (  36.7 C)  TempSrc: Oral Oral Oral Oral  Resp: 18 16 20 16   Height:      Weight:      SpO2: 100% 100% 99% 99%    General: Pt is alert, follows commands appropriately, not in acute distress Cardiovascular: Regular rate and rhythm, S1/S2 +, no murmurs, no rubs, no gallops Respiratory: Clear to auscultation bilaterally, no wheezing, no crackles, no rhonchi Abdominal: Soft, non  tender, non distended, bowel sounds +, no guarding Extremities: no edema, no cyanosis, pulses palpable bilaterally DP and PT Neuro: Grossly nonfocal  Discharge Instructions      Discharge Orders   Future Orders Complete By Expires   Diet - low sodium heart healthy  As directed    Increase activity slowly  As directed        Medication List         ciprofloxacin 500 MG tablet  Commonly known as:  CIPRO  Take 1 tablet (500 mg total) by mouth 2 (two) times daily.     docusate sodium 100 MG capsule  Commonly known as:  COLACE  Take 100 mg by mouth 2 (two) times daily.     HYDROcodone-acetaminophen 5-325 MG per tablet  Commonly known as:  NORCO/VICODIN  Take 1-2 tablets by mouth every 6 (six) hours as needed for moderate pain.     oxyCODONE-acetaminophen 7.5-325 MG per tablet  Commonly known as:  PERCOCET  Take 1 tablet by mouth every 4 (four) hours as needed for pain.     phenazopyridine 95 MG tablet  Commonly known as:  PYRIDIUM  Take 1 tablet (95 mg total) by mouth 3 (three) times daily as needed for pain.       Follow-up Information   Follow up with DAHLSTEDT, Bertram Millard, MD. (We will call to schedule)    Specialty:  Urology   Contact information:   [address] [place] [address] [phone]       Follow up with DAHLSTEDT, Bertram Millard, MD. (2 weeks--call for appt)    Specialty:  Urology   Contact information:   [address] [place] [address] [phone]       Follow up with Debbora Presto, MD In 2 weeks. (As needed if symptoms worsen)    Specialty:  Internal Medicine   Contact information:   [address] [place] [address] [phone]        The results of significant diagnostics from this hospitalization (including imaging, microbiology, ancillary and laboratory) are listed below for reference.     Microbiology: Recent Results (from the past 240 hour(s))  GC/CHLAMYDIA PROBE AMP      Status: None   Collection Time    12/13/12  5:56 AM      Result Value Range Status   CT Probe RNA NEGATIVE  NEGATIVE Final   GC Probe RNA NEGATIVE  NEGATIVE Final   Comment: (NOTE)                                                                                              Normal Reference Range: Negative  Assay performed using the Gen-Probe APTIMA COMBO2 (R) Assay.     Acceptable specimen types for this assay include APTIMA Swabs (Unisex,     endocervical, urethral, or vaginal), first void urine, and ThinPrep     liquid based cytology samples.     Performed at [facility]  URINE CULTURE     Status: None   Collection Time    12/17/12 12:25 AM      Result Value Range Status   Specimen Description URINE, CLEAN CATCH   Final   Special Requests A   Final   Culture  Setup Time     Final   Value: 12/17/2012 05:10     Performed at [facility]   Culture     Final   Value: NO GROWTH     Performed at [facility]   Report Status 12/18/2012 FINAL   Final  CULTURE, BLOOD (ROUTINE X 2)     Status: None   Collection Time    12/17/12  2:10 AM      Result Value Range Status   Specimen Description BLOOD LEFT HAND   Final   Special Requests BOTTLES DRAWN AEROBIC AND ANAEROBIC 1.5CC   Final   Culture  Setup Time     Final   Value: 12/17/2012 15:10     Performed at [facility]   Culture     Final   Value:        BLOOD CULTURE RECEIVED NO GROWTH TO DATE CULTURE WILL BE HELD FOR 5 DAYS BEFORE ISSUING A FINAL NEGATIVE REPORT     Performed at [facility]   Report Status PENDING   Incomplete  CULTURE, BLOOD (ROUTINE X 2)     Status: None   Collection Time    12/17/12  2:10 AM      Result Value Range Status   Specimen Description BLOOD RIGHT ANTECUBITAL   Final   Special Requests BOTTLES DRAWN AEROBIC AND ANAEROBIC 5CC   Final   Culture  Setup Time     Final   Value: 12/17/2012 15:11     Performed at [facility]   Culture     Final    Value:        BLOOD CULTURE RECEIVED NO GROWTH TO DATE CULTURE WILL BE HELD FOR 5 DAYS BEFORE ISSUING A FINAL NEGATIVE REPORT     Performed at [facility]   Report Status PENDING   Incomplete  SURGICAL PCR SCREEN     Status: None   Collection Time    12/17/12 11:48 AM      Result Value Range Status   MRSA, PCR NEGATIVE  NEGATIVE Final   Staphylococcus aureus NEGATIVE  NEGATIVE Final   Comment:            The Xpert SA Assay (FDA     approved for NASAL specimens     in patients over 10 years of age),     is one component of     a comprehensive surveillance     program.  Test performance has     been validated by [employer] for patients greater     than or equal to 22 year old.     It is not intended     to diagnose infection nor to     guide or monitor treatment.     Labs: Basic Metabolic Panel:  Recent Labs Lab 12/16/12 2356  NA 136  K 3.6  CL 102  CO2 26  GLUCOSE 100*  BUN 13  CREATININE 0.80  CALCIUM 9.8   CBC:  Recent Labs Lab 12/16/12 2356  WBC 13.0*  NEUTROABS 9.5*  HGB 14.7  HCT 41.1  MCV 93.8  PLT 280   SIGNED: Time coordinating discharge: Over 30 minutes  Debbora Presto, MD  [facility] 12/19/2012, 5:03 PM Pager [phone]  If 7PM-7AM, please contact night-coverage [email or web address] Password [email or web address]

## 2012-12-21 NOTE — Anesthesia Postprocedure Evaluation (Signed)
  Anesthesia Post-op Note  Patient: Jeffery Cross  Procedure(s) Performed: Procedure(s) (LRB): TRANSURETHRAL RESECTION OF THE PROSTATE (TURP) - Unroofing of prostatic abscess (N/A)  Patient Location: PACU  Anesthesia Type: General  Level of Consciousness: awake and alert   Airway and Oxygen Therapy: Patient Spontanous Breathing  Post-op Pain: mild  Post-op Assessment: Post-op Vital signs reviewed, Patient's Cardiovascular Status Stable, Respiratory Function Stable, Patent Airway and No signs of Nausea or vomiting  Last Vitals:  Filed Vitals:   12/18/12 1300  BP: 110/64  Pulse: 74  Temp: 36.7 C  Resp: 16    Post-op Vital Signs: stable   Complications: No apparent anesthesia complications

## 2012-12-23 LAB — CULTURE, BLOOD (ROUTINE X 2): Culture: NO GROWTH

## 2013-08-10 ENCOUNTER — Emergency Department (HOSPITAL_BASED_OUTPATIENT_CLINIC_OR_DEPARTMENT_OTHER)
Admission: EM | Admit: 2013-08-10 | Discharge: 2013-08-10 | Disposition: A | Discharge status: Home / Self Care | Payer: Self-pay | Attending: Emergency Medicine | Admitting: Emergency Medicine

## 2013-08-10 ENCOUNTER — Encounter (HOSPITAL_BASED_OUTPATIENT_CLINIC_OR_DEPARTMENT_OTHER): Payer: Self-pay | Admitting: Emergency Medicine

## 2013-08-10 DIAGNOSIS — B0229 Other postherpetic nervous system involvement: Secondary | ICD-10-CM | POA: Insufficient documentation

## 2013-08-10 DIAGNOSIS — F17200 Nicotine dependence, unspecified, uncomplicated: Secondary | ICD-10-CM | POA: Insufficient documentation

## 2013-08-10 DIAGNOSIS — Z8701 Personal history of pneumonia (recurrent): Secondary | ICD-10-CM | POA: Insufficient documentation

## 2013-08-10 DIAGNOSIS — Z792 Long term (current) use of antibiotics: Secondary | ICD-10-CM | POA: Insufficient documentation

## 2013-08-10 DIAGNOSIS — Z79899 Other long term (current) drug therapy: Secondary | ICD-10-CM | POA: Insufficient documentation

## 2013-08-10 MED ORDER — FAMCICLOVIR 500 MG PO TABS: 500.0000 mg | ORAL_TABLET | Freq: Three times a day (TID) | ORAL | Status: DC

## 2013-08-10 MED ORDER — GABAPENTIN 100 MG PO CAPS: 100.0000 mg | ORAL_CAPSULE | Freq: Three times a day (TID) | ORAL | Status: DC

## 2013-08-10 MED ORDER — OXYCODONE-ACETAMINOPHEN 5-325 MG PO TABS: 2.0000 | ORAL_TABLET | Freq: Three times a day (TID) | ORAL | Status: DC | PRN

## 2013-08-10 NOTE — Discharge Instructions (Signed)
Postherpetic Neuralgia Shingles is a painful disease. It is caused by the herpes zoster virus. This is the same virus which also causes chickenpox. It can affect the torso, limbs, or the face. For most people, shingles is a condition of rather sudden onset. Pain usually lasts about 1 month. In older patients, or patients with poor immune systems, a painful, long-standing (chronic) condition called postherpetic neuralgia can develop. This condition rarely happens before age 56. But at least 50% of people over 50 become affected following an attack of shingles. There is a natural tendency for this condition to improve over time with no treatment. Less than 5% of patients have pain that lasts for more than 1 year. DIAGNOSIS  Herpes is usually easily diagnosed on physical exam. Pain sometimes follows when the skin sores (lesions) have disappeared. It is called postherpetic neuralgia. That name simply means the pain that follows herpes. TREATMENT   Treating this condition may be difficult. Usually one of the tricyclic antidepressants, often amitriptyline, is the first line of treatment. There is evidence that the sooner these medications are given, the more likely they are to reduce pain.  Conventional analgesics, regional nerve blocks, and anticonvulsants have little benefit in most cases when used alone. Other tricyclic anti-depressants are used as a second option if the first antidepressant is unsuccessful.  Anticonvulsants, including carbamazepine, have been found to provide some added benefit when used with a tricyclic anti-depressant. This is especially for the stabbing type of pain similar to that of trigeminal neuralgia.  Chronic opioid therapy. This is a strong narcotic pain medication. It is used to treat pain that is resistant to other measures. The issues of dependency and tolerance can be reduced with closely managed care.  Some cream treatments are applied locally to the affected area. They  can help when used with other treatments. Their use may be difficult in the case of postherpetic trigeminal neuralgia. This is involved with the face. So the substances can irritate the eye and the skin around the eye. Examples of creams used include Capsaicin and lidocaine creams.  For shingles, antiviral therapies along with analgesics are recommended. Studies of the effect of anti-viral agents such as acyclovir on shingles have been done. They show improved rates of healing and decreased severity of sudden (acute) pain. Some observations suggest that nerve blocks during shingles infection will:  Reduce pain.  Shorten the acute episode.  Prevent the emergence of postherpetic neuralgia. Viral medications used include Acyclovir (Zovirax), Valacyclovir, Famciclovir and a lysine diet. Document Released: 04/11/2002 Document Revised: 04/13/2011 Document Reviewed: 01/10/2013 [facility] Patient Information 2015 [place], [place]. This information is not intended to replace advice given to you by your health care provider. Make sure you discuss any questions you have with your health care provider.  Shingles Shingles (herpes zoster) is an infection that is caused by the same virus that causes chickenpox (varicella). The infection causes a painful skin rash and fluid-filled blisters, which eventually break open, crust over, and heal. It may occur in any area of the body, but it usually affects only one side of the body or face. The pain of shingles usually lasts about 1 month. However, some people with shingles may develop long-term (chronic) pain in the affected area of the body. Shingles often occurs many years after the person had chickenpox. It is more common:  In people older than 50 years.  In people with weakened immune systems, such as those with HIV, AIDS, or cancer.  In people taking medicines  that weaken the immune system, such as transplant medicines.  In people under great stress. CAUSES    Shingles is caused by the varicella zoster virus (VZV), which also causes chickenpox. After a person is infected with the virus, it can remain in the person's body for years in an inactive state (dormant). To cause shingles, the virus reactivates and breaks out as an infection in a nerve root. The virus can be spread from person to person (contagious) through contact with open blisters of the shingles rash. It will only spread to people who have not had chickenpox. When these people are exposed to the virus, they may develop chickenpox. They will not develop shingles. Once the blisters scab over, the person is no longer contagious and cannot spread the virus to others. SYMPTOMS  Shingles shows up in stages. The initial symptoms may be pain, itching, and tingling in an area of the skin. This pain is usually described as burning, stabbing, or throbbing.In a few days or weeks, a painful red rash will appear in the area where the pain, itching, and tingling were felt. The rash is usually on one side of the body in a band or belt-like pattern. Then, the rash usually turns into fluid-filled blisters. They will scab over and dry up in approximately 2-3 weeks. Flu-like symptoms may also occur with the initial symptoms, the rash, or the blisters. These may include:  Fever.  Chills.  Headache.  Upset stomach. DIAGNOSIS  Your caregiver will perform a skin exam to diagnose shingles. Skin scrapings or fluid samples may also be taken from the blisters. This sample will be examined under a microscope or sent to a lab for further testing. TREATMENT  There is no specific cure for shingles. Your caregiver will likely prescribe medicines to help you manage the pain, recover faster, and avoid long-term problems. This may include antiviral drugs, anti-inflammatory drugs, and pain medicines. HOME CARE INSTRUCTIONS   Take a cool bath or apply cool compresses to the area of the rash or blisters as directed. This may  help with the pain and itching.   Only take over-the-counter or prescription medicines as directed by your caregiver.   Rest as directed by your caregiver.  Keep your rash and blisters clean with mild soap and cool water or as directed by your caregiver.  Do not pick your blisters or scratch your rash. Apply an anti-itch cream or numbing creams to the affected area as directed by your caregiver.  Keep your shingles rash covered with a loose bandage (dressing).  Avoid skin contact with:  Babies.   Pregnant women.   Children with eczema.   Elderly people with transplants.   People with chronic illnesses, such as leukemia or AIDS.   Wear loose-fitting clothing to help ease the pain of material rubbing against the rash.  Keep all follow-up appointments with your caregiver.If the area involved is on your face, you may receive a referral for follow-up to a specialist, such as an eye doctor (ophthalmologist) or an ear, nose, and throat (ENT) doctor. Keeping all follow-up appointments will help you avoid eye complications, chronic pain, or disability.  SEEK IMMEDIATE MEDICAL CARE IF:   You have facial pain, pain around the eye area, or loss of feeling on one side of your face.  You have ear pain or ringing in your ear.  You have loss of taste.  Your pain is not relieved with prescribed medicines.   Your redness or swelling spreads.   You  have more pain and swelling or develop pus drainage.  Your condition is worsening or has changed.   You have a feveror persistent symptoms for more than 2-3 days.  You have a fever and your symptoms suddenly get worse. MAKE SURE YOU:  Understand these instructions.  Will watch your condition.  Will get help right away if you are not doing well or get worse. Document Released: 01/19/2005 Document Revised: 10/14/2011 Document Reviewed: 09/03/2011 [facility] Patient Information 2015 [place], [place]. This information is not  intended to replace advice given to you by your health care provider. Make sure you discuss any questions you have with your health care provider.

## 2013-08-10 NOTE — ED Notes (Signed)
Patient states he has had a red rash on his right trunk with radiation onto the entire back for one month.  Describes the rash as painful and itchy.  States pain started on his right trunk and has spread across his entire back.

## 2013-08-10 NOTE — ED Provider Notes (Signed)
CSN: [id number]     Arrival date & time 08/10/13  1615 History   First MD Initiated Contact with Patient 08/10/13 1712     Chief Complaint  Patient presents with  . Rash     (Consider location/radiation/quality/duration/timing/severity/associated sxs/prior Treatment) HPI 38 year old male presents with 3-4 weeks of a constant unilateral right-sided lower flank dermatomal-type rash which he states started out as painful blisters of hypersensitivity burning skin sensation but now the blisters have healed he just has some residual redness to the skin in that area but still has severe pain with hypersensitivity to light touch to burning sensation constantly 24 hours a day without radiation or associated symptoms with no fever no chest pain no cough no shortness breath no abdominal pain no vomiting no trauma no other concerns. There is no treatment prior to arrival. The rash is a painful burning itching sensation with redness to the skin in that localized a dermatomal-type region after he had healing of the blisters that started in that area. Past Medical History  Diagnosis Date  . Pneumonia    Past Surgical History  Procedure Laterality Date  . Back surgery    . Appendectomy    . Transurethral resection of prostate N/A 12/17/2012    Procedure: TRANSURETHRAL RESECTION OF THE PROSTATE (TURP) - Unroofing of prostatic abscess;  Surgeon: Marcine MatarStephen Dahlstedt, MD;  Location: [facility];  Service: Urology;  Laterality: N/A;   No family history on file. History  Substance Use Topics  . Smoking status: Current Every Day Smoker  . Smokeless tobacco: Not on file  . Alcohol Use: Yes     Comment: occasional    Review of Systems  10 Systems reviewed and are negative for acute change except as noted in the HPI.  Allergies  Review of patient's allergies indicates no known allergies.  Home Medications   Prior to Admission medications   Medication Sig Start Date End Date Taking? Authorizing Provider   ciprofloxacin (CIPRO) 500 MG tablet Take 1 tablet (500 mg total) by mouth 2 (two) times daily. 12/13/12   Deari Sessler L Molpus, MD  docusate sodium (COLACE) 100 MG capsule Take 100 mg by mouth 2 (two) times daily.    Historical Provider, MD  famciclovir (FAMVIR) 500 MG tablet Take 1 tablet (500 mg total) by mouth 3 (three) times daily. 08/10/13   Hurman HornJohn M Jorgia Manthei, MD  gabapentin (NEURONTIN) 100 MG capsule Take 1 capsule (100 mg total) by mouth 3 (three) times daily. 08/10/13   Hurman HornJohn M Mirela Parsley, MD  HYDROcodone-acetaminophen (NORCO/VICODIN) 5-325 MG per tablet Take 1-2 tablets by mouth every 6 (six) hours as needed for moderate pain. 12/13/12   Carlisle BeersJohn L Molpus, MD  oxyCODONE-acetaminophen (PERCOCET) 5-325 MG per tablet Take 2 tablets by mouth every 8 (eight) hours as needed for severe pain. 08/10/13   Hurman HornJohn M Carlene Bickley, MD  oxyCODONE-acetaminophen (PERCOCET) 7.5-325 MG per tablet Take 1 tablet by mouth every 4 (four) hours as needed for pain. 12/19/12   Dorothea OgleIskra M Myers, MD  phenazopyridine (PYRIDIUM) 95 MG tablet Take 1 tablet (95 mg total) by mouth 3 (three) times daily as needed for pain. 12/18/12   Chelsea AusStephen M Dahlstedt, MD   BP 117/62  Pulse 100  Temp(Src) 98.4 F (36.9 C) (Oral)  Resp 20  Ht 5\' 9"  (1.753 m)  Wt 150 lb (68.04 kg)  BMI 22.14 kg/m2  SpO2 100% Physical Exam  Nursing note and vitals reviewed. Constitutional:  Awake, alert, nontoxic appearance.  HENT:  Head: Atraumatic.  Eyes:  Right eye exhibits no discharge. Left eye exhibits no discharge.  Neck: Neck supple.  Cardiovascular: Normal rate and regular rhythm.   No murmur heard. Pulmonary/Chest: Effort normal and breath sounds normal. No respiratory distress. He has no wheezes. He has no rales. He exhibits no tenderness.  Abdominal: Soft. Bowel sounds are normal. He exhibits no distension and no mass. There is no tenderness. There is no rebound and no guarding.  Musculoskeletal: He exhibits no tenderness.  Baseline ROM, no obvious new focal  weakness.  Neurological: He is alert.  Mental status and motor strength appears baseline for patient and situation.  Skin: Rash noted.  Right lower lateral abdomen and flank have generalized erythema to the skin with hypersensitivity to light touch without excessive warmth or induration to suggest cellulitis but given the one-month history with initial rash starting out as blisters I suspect patient most likely has postherpetic neuralgia given the unilateral dermatomal distribution of this rash and painful hypersensitivity to light touch  Psychiatric: He has a normal mood and affect.    ED Course  Procedures (including critical care time) Patient informed of clinical course, understand medical decision-making process, and agree with plan. Labs Review Labs Reviewed - No data to display  Imaging Review No results found.   EKG Interpretation None      MDM   Final diagnoses:  Postherpetic neuralgia    I doubt any other EMC precluding discharge at this time including, but not necessarily limited to the following:cellulitis, SBI.    Hurman Horn, MD 08/11/13 1116

## 2013-08-24 ENCOUNTER — Encounter (HOSPITAL_COMMUNITY): Payer: Self-pay | Admitting: Emergency Medicine

## 2013-08-24 ENCOUNTER — Emergency Department (HOSPITAL_COMMUNITY)
Admission: EM | Admit: 2013-08-24 | Discharge: 2013-08-24 | Disposition: A | Discharge status: Home / Self Care | Payer: Self-pay | Attending: Emergency Medicine | Admitting: Emergency Medicine

## 2013-08-24 DIAGNOSIS — R10A1 Flank pain, right side: Secondary | ICD-10-CM

## 2013-08-24 DIAGNOSIS — Z792 Long term (current) use of antibiotics: Secondary | ICD-10-CM | POA: Insufficient documentation

## 2013-08-24 DIAGNOSIS — Z79899 Other long term (current) drug therapy: Secondary | ICD-10-CM | POA: Insufficient documentation

## 2013-08-24 DIAGNOSIS — R109 Unspecified abdominal pain: Secondary | ICD-10-CM | POA: Insufficient documentation

## 2013-08-24 DIAGNOSIS — B029 Zoster without complications: Secondary | ICD-10-CM | POA: Insufficient documentation

## 2013-08-24 DIAGNOSIS — F17200 Nicotine dependence, unspecified, uncomplicated: Secondary | ICD-10-CM | POA: Insufficient documentation

## 2013-08-24 MED ORDER — NAPROXEN 500 MG PO TABS
500.0000 mg | ORAL_TABLET | Freq: Two times a day (BID) | ORAL | Status: DC
Start: 2013-08-24 — End: 2014-12-10

## 2013-08-24 MED ORDER — OXYCODONE-ACETAMINOPHEN 5-325 MG PO TABS: 1.0000 | ORAL_TABLET | ORAL | Status: DC | PRN

## 2013-08-24 MED ORDER — CYCLOBENZAPRINE HCL 10 MG PO TABS
5.0000 mg | ORAL_TABLET | Freq: Once | ORAL | Status: AC
  Administered 2013-08-24: 5 mg via ORAL
  Filled 2013-08-24: qty 1

## 2013-08-24 MED ORDER — KETOROLAC TROMETHAMINE 30 MG/ML IJ SOLN
60.0000 mg | Freq: Once | INTRAMUSCULAR | Status: AC
  Administered 2013-08-24: 60 mg via INTRAMUSCULAR
  Filled 2013-08-24: qty 2

## 2013-08-24 MED ORDER — OXYCODONE-ACETAMINOPHEN 5-325 MG PO TABS
2.0000 | ORAL_TABLET | Freq: Once | ORAL | Status: AC
  Administered 2013-08-24: 2 via ORAL
  Filled 2013-08-24: qty 2

## 2013-08-24 MED ORDER — CYCLOBENZAPRINE HCL 5 MG PO TABS: 5.0000 mg | ORAL_TABLET | Freq: Three times a day (TID) | ORAL | Status: DC | PRN

## 2013-08-24 NOTE — Progress Notes (Signed)
[facility] did not get to see patient but will be sending information about [facility] Orange Card program to help patient establish primary care, using the address provided.  °

## 2013-08-24 NOTE — ED Notes (Signed)
Pt directed not to drive or operate heavy machinery while taking pain medication.  Pt reports "baby's momma" will be taking him home.

## 2013-08-24 NOTE — ED Notes (Signed)
MD at bedside. 

## 2013-08-24 NOTE — ED Provider Notes (Signed)
CSN: [id number]     Arrival date & time 08/24/13  0741 History   First MD Initiated Contact with Patient 08/24/13 0745     Chief Complaint  Patient presents with  . Herpes Zoster     (Consider location/radiation/quality/duration/timing/severity/associated sxs/prior Treatment) HPI Comments: 38 yo male diagnosed with shingles a few weeks ago.  Given famciclovir and gabapentin.  These have not seemed to help much.  Two days ago, pain got worse and spread up and down his right side.    Patient is a 38 y.o. male presenting with flank pain.  Flank Pain This is a new problem. The current episode started 2 days ago. The problem occurs constantly. The problem has been gradually worsening. Pertinent negatives include no chest pain, no abdominal pain and no shortness of breath. Exacerbated by: moving, twisting, working with right arm.  The symptoms are relieved by rest. Treatments tried: antibiotics and pain medicine.    Past Medical History  Diagnosis Date  . Pneumonia    Past Surgical History  Procedure Laterality Date  . Back surgery    . Appendectomy    . Transurethral resection of prostate N/A 12/17/2012    Procedure: TRANSURETHRAL RESECTION OF THE PROSTATE (TURP) - Unroofing of prostatic abscess;  Surgeon: Marcine Matar, MD;  Location: [facility];  Service: Urology;  Laterality: N/A;   History reviewed. No pertinent family history. History  Substance Use Topics  . Smoking status: Current Every Day Smoker  . Smokeless tobacco: Not on file  . Alcohol Use: Yes     Comment: occasional    Review of Systems  Respiratory: Negative for cough and shortness of breath.   Cardiovascular: Negative for chest pain.  Gastrointestinal: Negative for vomiting, abdominal pain and diarrhea.  Genitourinary: Positive for flank pain. Negative for dysuria and hematuria.  All other systems reviewed and are negative.     Allergies  Review of patient's allergies indicates no known allergies.  Home  Medications   Prior to Admission medications   Medication Sig Start Date End Date Taking? Authorizing Provider  ciprofloxacin (CIPRO) 500 MG tablet Take 1 tablet (500 mg total) by mouth 2 (two) times daily. 12/13/12   Isaih Bulger L Molpus, MD  docusate sodium (COLACE) 100 MG capsule Take 100 mg by mouth 2 (two) times daily.    Historical Provider, MD  famciclovir (FAMVIR) 500 MG tablet Take 1 tablet (500 mg total) by mouth 3 (three) times daily. 08/10/13   Hurman Horn, MD  gabapentin (NEURONTIN) 100 MG capsule Take 1 capsule (100 mg total) by mouth 3 (three) times daily. 08/10/13   Hurman Horn, MD  HYDROcodone-acetaminophen (NORCO/VICODIN) 5-325 MG per tablet Take 1-2 tablets by mouth every 6 (six) hours as needed for moderate pain. 12/13/12   Carlisle Beers Molpus, MD  oxyCODONE-acetaminophen (PERCOCET) 5-325 MG per tablet Take 2 tablets by mouth every 8 (eight) hours as needed for severe pain. 08/10/13   Hurman Horn, MD  oxyCODONE-acetaminophen (PERCOCET) 7.5-325 MG per tablet Take 1 tablet by mouth every 4 (four) hours as needed for pain. 12/19/12   Dorothea Ogle, MD  phenazopyridine (PYRIDIUM) 95 MG tablet Take 1 tablet (95 mg total) by mouth 3 (three) times daily as needed for pain. 12/18/12   Chelsea Aus, MD   BP 121/74  Pulse 94  Temp(Src) 98.7 F (37.1 C) (Oral)  Resp 18  SpO2 100% Physical Exam  Nursing note and vitals reviewed. Constitutional: He is oriented to person, place, and time.  He appears well-developed and well-nourished. No distress.  HENT:  Head: Normocephalic and atraumatic.  Eyes: Conjunctivae are normal. No scleral icterus.  Neck: Neck supple.  Cardiovascular: Normal rate and intact distal pulses.   Pulmonary/Chest: Effort normal. No stridor. No respiratory distress. He has no decreased breath sounds. He has no wheezes. He has no rales.  Abdominal: Normal appearance. He exhibits no distension.  Musculoskeletal:       Back:  Neurological: He is alert and oriented to  person, place, and time.  Skin: Skin is warm and dry. No rash noted.     Psychiatric: He has a normal mood and affect. His behavior is normal.    ED Course  Procedures (including critical care time) Labs Review Labs Reviewed - No data to display  Imaging Review No results found.   EKG Interpretation None      MDM   Final diagnoses:  Right flank pain    Pt seen a few weeks ago with pain after shingles type rash.  Now he is have severe pain up and down his right side and shoulder.  Pain seems consistent with musculoskeletal pain.  The area of pain far exceeds the dermatome of his prior rash.  He works as a Dealerright handed carpenter.  I suspect his symptoms are secondary to the physical demands of his job, with altered mechanics secondary to his herpetic pain possibly contributing.  Will try flexeril and percocet.  Advised to return if he develops fevers, return or spread of rash, or other concerning symptoms.    Added Toradol with good relief of symptoms.   Candyce ChurnJohn David Masaye Gatchalian III, MD 08/25/13 [phone]

## 2013-08-24 NOTE — ED Notes (Signed)
Pt escorted to discharge window. Verbalized understanding discharge instructions. In no acute distress.   

## 2013-08-24 NOTE — ED Notes (Signed)
Pt was seen on 7/9 for shingles.  Pt states completed antibiotic and pain continues.  Pain is on right side with rash.  Pt states no open lesions.

## 2013-08-24 NOTE — Discharge Instructions (Signed)
Flank Pain °Flank pain refers to pain that is located on the side of the body between the upper abdomen and the back. The pain may occur over a short period of time (acute) or may be long-term or reoccurring (chronic). It may be mild or severe. Flank pain can be caused by many things. °CAUSES  °Some of the more common causes of flank pain include: °· Muscle strains.   °· Muscle spasms.   °· A disease of your spine (vertebral disk disease).   °· A lung infection (pneumonia).   °· Fluid around your lungs (pulmonary edema).   °· A kidney infection.   °· Kidney stones.   °· A very painful skin rash caused by the chickenpox virus (shingles).   °· Gallbladder disease.   °HOME CARE INSTRUCTIONS  °Home care will depend on the cause of your pain. In general, °· Rest as directed by your caregiver. °· Drink enough fluids to keep your urine clear or pale yellow. °· Only take over-the-counter or prescription medicines as directed by your caregiver. Some medicines may help relieve the pain. °· Tell your caregiver about any changes in your pain. °· Follow up with your caregiver as directed. °SEEK IMMEDIATE MEDICAL CARE IF:  °· Your pain is not controlled with medicine.   °· You have new or worsening symptoms. °· Your pain increases.   °· You have abdominal pain.   °· You have shortness of breath.   °· You have persistent nausea or vomiting.   °· You have swelling in your abdomen.   °· You feel faint or pass out.   °· You have blood in your urine. °· You have a fever or persistent symptoms for more than 2-3 days. °· You have a fever and your symptoms suddenly get worse. °MAKE SURE YOU:  °· Understand these instructions. °· Will watch your condition. °· Will get help right away if you are not doing well or get worse. °Document Released: 03/12/2005 Document Revised: 10/14/2011 Document Reviewed: 09/03/2011 °ExitCare® Patient Information ©2015 [employer]. This information is not intended to replace advice given to you by your  health care provider. Make sure you discuss any questions you have with your health care provider. ° ° °Emergency Department Resource Guide °1) Find a Doctor and Pay Out of Pocket °Although you won't have to find out who is covered by your insurance plan, it is a good idea to ask around and get recommendations. You will then need to call the office and see if the doctor you have chosen will accept you as a new patient and what types of options they offer for patients who are self-pay. Some doctors offer discounts or will set up payment plans for their patients who do not have insurance, but you will need to ask so you aren't surprised when you get to your appointment. ° °2) Contact Your Local Health Department °Not all health departments have doctors that can see patients for sick visits, but many do, so it is worth a call to see if yours does. If you don't know where your local health department is, you can check in your phone book. The CDC also has a tool to help you locate your state's health department, and many state websites also have listings of all of their local health departments. ° °3) Find a Walk-in Clinic °If your illness is not likely to be very severe or complicated, you may want to try a walk in clinic. These are popping up all over the country in pharmacies, drugstores, and shopping centers. They're usually staffed by nurse practitioners   or physician assistants that have been trained to treat common illnesses and complaints. They're usually fairly quick and inexpensive. However, if you have serious medical issues or chronic medical problems, these are probably not your best option. ° °No Primary Care Doctor: °- Call [facility] at  832-8000 - they can help you locate a primary care doctor that  accepts your insurance, provides certain services, etc. °- Physician Referral Service- 1-800-533-3463 ° °Chronic Pain Problems: °Organization         Address  Phone   Notes  °[facility]  (336) 297-2271 Patients need to be referred by their primary care doctor.  ° °Medication Assistance: °Organization         Address  Phone   Notes  °[place] [facility] [address] °[place], [place] [address] (336) 641-8030 --Must be a resident of [place] °-- Must have NO insurance coverage whatsoever (no Medicaid/ Medicare, etc.) °-- The pt. MUST have a primary care doctor that directs their care regularly and follows them in the community °  °[facility]  (866) 331-1348   °[employer]  (888) 892-1162   ° °Agencies that provide inexpensive medical care: °Organization         Address  Phone   Notes  °[facility]  (336) 832-8035   °[facility]    (336) 832-7272   °[facility] [address] °[place], [place] [address] (336) 832-4777   °[facility] [address], °[place] (336) 271-4999   °[facility]    (336) 373-0678   °[facility]    (336) 272-1050   °[facility] ° [address], [place] Phone:  (336) 832-4444, Fax:  (336) 832-4440 Hours of Operation:  9 am - 6 pm, M-F.  Also accepts Medicaid/Medicare and self-pay.  °[facility] ° [address], [place] Phone: (336) 832-3150, Fax: (336) 832-3151. Hours of Operation:  8:30 am - 5:30 pm, M-F.  Also accepts Medicaid and self-pay.  °[facility] [place] [address], [place] Phone: (336) 878-6027   °[facility] [address], [place], [place] (336)723-1848, Ext. 123 Mondays & Thursdays: 7-9 AM.  First 15 patients are seen on a first come, first serve basis. °  ° °Medicaid-accepting [place] Providers: ° °Organization         Address  Phone   Notes  °[facility] [address], [place] (336) 641-2100 Also accepts self-pay patients.  °[facility] [address],  [place] ° (336) 856-9996   °[facility] [address], [place] (336) 288-8857   °[facility] [address] [place] [address], [place] (336) 299-7000   °Veita Bland [address], [place]  ° (336) 373-1557 Only accepts [place] Access Medicaid patients after they have their name applied to their card.  ° °Self-Pay (no insurance) in [place]: ° °Organization         Address  Phone   Notes  °Sickle Cell Patients, [facility] [address], [place] (336) 832-1970   °[facility] [address], [place] (336) 832-4400   ° [facility] ° [address], [place] (336) 992-4800   °[facility]/Dr. Osei-Bonsu ° [address]   [place] [address], [place] or [address], [place] (336) 841-8500 Phone number for both [place] and [place] locations is the same.  °[facility] [address], [place] (336) 299-0000   °[facility] [address] [place] [address], [place] or [address] (336) 852-7530 °(336) 878-2260   °[facility] [address], [place] (336) 350-1642, phone; (336) 294-5005, fax Sees patients 1st and 3rd Saturday of every month.  Must not qualify for public or private insurance (i.e. Medicaid, Medicare, [place] Health Choice, Veterans' Benefits) • Household income should be no more than 200% of the poverty level •The clinic cannot treat you if you are pregnant or think you are pregnant • Sexually transmitted diseases are not treated at the clinic.  ° ° °Dental Care: °Organization         Address  Phone  Notes  °[facility] [address], [place] (336) 641-6152 Accepts children up to age 21 who are enrolled in Medicaid or [place] Health Choice; pregnant women with a Medicaid card; and children who have applied for Medicaid or [place] Health  Choice, but were declined, whose parents can pay a reduced fee at time of service.  °[facility] [place]  [address], [place] (336) 641-7733 Accepts children up to age 21 who are enrolled in Medicaid or [place] Health Choice; pregnant women with a Medicaid card; and children who have applied for Medicaid or [place] Health Choice, but were declined, whose parents can pay a reduced fee at time of service.  °[facility] ° [address], [place] (336) 641-4533 Patients are seen by appointment only. Walk-ins are not accepted. [facility] will see patients 18 years of age and older. °Monday - Tuesday (8am-5pm) °Most Wednesdays (8:30-5pm) °$30 per visit, cash only  °[facility] ° [address], [place] (336) 641-4533 Patients are seen by appointment only. Walk-ins are not accepted. [facility] will see patients 18 years of age and older. °One Wednesday Evening (Monthly: Volunteer Based).  $30 per visit, cash only  °[facility]  (919) 537-3737 for adults; Children under age 4, call Graduate Pediatric Dentistry at (919) [phone]. Children aged 4-14, please call (919) 537-3737 to request a pediatric application. ° Dental services are provided in all areas of dental care including fillings, crowns and bridges, complete and partial dentures, implants, gum treatment, root canals, and extractions. Preventive care is also provided. Treatment is provided to both adults and children. °Patients are selected via a lottery and there is often a waiting list. °  °[facility] [address], °[place] ° (336) 763-8833 [email or web address] °  °[facility] [address], [place], [place] (336)723-1848, Ext. 123 Second and Fourth Thursday of each month, opens at 6:30 AM; Clinic ends at 9 AM.  Patients are seen on a first-come first-served basis, and a limited number are seen during each  clinic.  ° °[facility] ° [address], [place], [place] (336) 723-7904   Eligibility Requirements °You must have lived in [place], [place], or [place] counties for at least the last three months. °  You cannot be eligible for state or federal sponsored healthcare insurance, including Veterans Administration, Medicaid, or Medicare. °  You generally cannot be eligible for healthcare insurance through your employer.  °    How to apply: °Eligibility screenings are held every Tuesday and Wednesday afternoon from 1:00 pm until 4:00 pm. You do not need an appointment for the interview!  °[facility] [address], [place], [place] 336-631-2330   °[facility]  336-342-8273   °[facility]  336-703-3100   °[facility]  336-570-6415   ° °Behavioral Health Resources in the Community: °Intensive Outpatient Programs °Organization         Address  Phone  Notes  °[facility] [address], [place], [place] 336-878-6098   °[facility] [address], [place], [place] 336-832-9800   °[facility] [address], [place], [place] ° 336-882-2125   °[facility] [address],  °[place], [place] 1-800-853-5163 or 336-641-4981   °Substance Abuse Resources °Organization         Address  Phone  Notes  °[facility]  336-882-2125   °[facility]  336-784-9470   °[facility]  336-285-9073   °[facility]  336-845-3988   °Residential & Outpatient Substance Abuse Program  1-800-659-3381   °Psychological Services °Organization         Address  Phone  Notes  °[facility]  336- 832-9600   °[facility]  336- 378-7881   °[facility] [address], [place] 1-800-853-5163 or 336-641-4981   ° °Mobile Crisis Teams °Organization         Address  Phone  Notes  °[facility], Mobile  Crisis Care Unit  1-877-626-1772   °Assertive °[facility] ° [address] [place], [place] 336-834-9664   °Sharon DeEsch [address] °[place] 336-554-5454   ° °Self-Help/Support Groups °Organization         Address  Phone             Notes  °[facility] - variety of support groups  336- 373-1402 Call for more information  °Narcotics Anonymous (NA), [facility] [address], °[place]  2 meetings at this location  ° °Residential Treatment Programs °Organization         Address  Phone  Notes  °[facility] [address],    °[place]  1-866-801-8205   °[facility] ° [address], [place], [place] 704-293-8524   °[facility] [address], [place] 336-845-3988 Admissions: 8am-3pm M-F  °[facility] [address]    °[place], [place] 336-841-1104   °[facility] [address], [place], [place] 336-379-7146   °[facility] [address]  °[place], [place] 336-285-9073   °[facility] Intensive Outpatient [address], [place], [place] 336-852-3033   °[facility] [address]  °[place], [place] 1-877-615-2722 or 336-784-9470   °[facility] [address] [place], [place] 336-227-7417 Accepts Medicaid  °[facility] [address]  °[place] 1-800-659-3381 Substance Abuse/Addiction Treatment  ° °[place] Behavioral Health Resources °Organization         Address  Phone  Notes  °[facility]  (888) 581-9988   °Julie Brannon, PhD [address] [place],    (336) 349-5553 or (336) 951-0000   °[facility]   [address] °[place],  (336) 349-4454   °[facility] [address],   [place], [place] (336) 342-8316 Insurance/Medicaid/sponsorship through [facility]  °[facility] [address]                                    [place], [place] (336) 342-8316 Therapy/tele-psych/case  °[facility] [address]  ° [place], [place] (336) 349-2233    °Dr. Arfeen  (336) 349-4544   °[facility] [place]  [employer] [place] [facility] 1) [address], [place] °2) [address], [place] °3)  [address], [place] (336) 349-3220 °(336) 342-7768 ° °(336) 342-8140   °[place] [facility] (336) 342-1394 or (336) 342-3537 (After Hours)    ° ° ° °

## 2014-12-10 ENCOUNTER — Emergency Department (HOSPITAL_BASED_OUTPATIENT_CLINIC_OR_DEPARTMENT_OTHER)
Admission: EM | Admit: 2014-12-10 | Discharge: 2014-12-10 | Disposition: A | Discharge status: Home / Self Care | Payer: Self-pay | Attending: Emergency Medicine | Admitting: Emergency Medicine

## 2014-12-10 ENCOUNTER — Encounter (HOSPITAL_BASED_OUTPATIENT_CLINIC_OR_DEPARTMENT_OTHER): Payer: Self-pay | Admitting: Emergency Medicine

## 2014-12-10 DIAGNOSIS — M545 Low back pain, unspecified: Secondary | ICD-10-CM

## 2014-12-10 DIAGNOSIS — Z8701 Personal history of pneumonia (recurrent): Secondary | ICD-10-CM | POA: Insufficient documentation

## 2014-12-10 DIAGNOSIS — Z9889 Other specified postprocedural states: Secondary | ICD-10-CM | POA: Insufficient documentation

## 2014-12-10 DIAGNOSIS — Z720 Tobacco use: Secondary | ICD-10-CM | POA: Insufficient documentation

## 2014-12-10 HISTORY — DX: Dorsalgia, unspecified: M54.9

## 2014-12-10 MED ORDER — NAPROXEN SODIUM 550 MG PO TABS: ORAL_TABLET | ORAL | Status: DC

## 2014-12-10 MED ORDER — OXYCODONE-ACETAMINOPHEN 5-325 MG PO TABS: 1.0000 | ORAL_TABLET | Freq: Four times a day (QID) | ORAL | Status: DC | PRN

## 2014-12-10 MED ORDER — NAPROXEN 250 MG PO TABS
500.0000 mg | ORAL_TABLET | Freq: Once | ORAL | Status: AC
  Administered 2014-12-10: 500 mg via ORAL
  Filled 2014-12-10: qty 2

## 2014-12-10 NOTE — ED Notes (Signed)
Pt states he picked something up on Friday and it has killed ever since

## 2014-12-10 NOTE — Discharge Instructions (Signed)

## 2014-12-10 NOTE — ED Notes (Signed)
Able to walk, moves legs bilat. WNL.  No problems urinating or deficating.

## 2014-12-10 NOTE — ED Provider Notes (Signed)
CSN: [id number]     Arrival date & time 12/10/14  2243 History  By signing my name below, I, Murriel HopperAlec Bankhead, attest that this documentation has been prepared under the direction and in the presence of Paula LibraJohn Edson Deridder, MD. Electronically Signed: Murriel HopperAlec Bankhead, ED Scribe. 12/10/2014. 11:07 PM.     Chief Complaint  Patient presents with  . Back Pain      The history is provided by the patient. No language interpreter was used.   HPI Comments: Jeffery Cross is a 39 y.o. male with remote history of back surgery who presents to the Emergency Department complaining of constant, worsening midline lumbar pain with numbness that radiates to his left buttock that has been present for three days. Pt reports he picked up a 10-15 pound box at work three days ago and turned suddenly, and then had immediate pain in his lower back. Pt states he is barely able to ambulate because of severe pain. Pain is worse with movement. Pt denies any problems urinating or having bowel movements. Pt reports he had back surgery 16 years ago near the area of current pain. He denies acute numbness or weakness in his legs apart from the left buttock.  Past Medical History  Diagnosis Date  . Pneumonia   . Back pain    Past Surgical History  Procedure Laterality Date  . Back surgery    . Appendectomy    . Transurethral resection of prostate N/A 12/17/2012    Procedure: TRANSURETHRAL RESECTION OF THE PROSTATE (TURP) - Unroofing of prostatic abscess;  Surgeon: Marcine MatarStephen Dahlstedt, MD;  Location: [facility];  Service: Urology;  Laterality: N/A;   History reviewed. No pertinent family history. Social History  Substance Use Topics  . Smoking status: Current Every Day Smoker  . Smokeless tobacco: None  . Alcohol Use: Yes     Comment: occasional    Review of Systems  A complete 10 system review of systems was obtained and all systems are negative except as noted in the HPI and PMH.    Allergies  Review of patient's allergies  indicates no known allergies.  Home Medications   Prior to Admission medications   Medication Sig Start Date End Date Taking? Authorizing Provider  naproxen sodium (ANAPROX DS) 550 MG tablet Take 1 tablet every 12 hours for pain. Best taken with a meal. 12/10/14   Paula LibraJohn Metzli Pollick, MD  oxyCODONE-acetaminophen (PERCOCET) 5-325 MG tablet Take 1-2 tablets by mouth every 6 (six) hours as needed (for pain). 12/10/14   Macguire Holsinger, MD   BP 119/72 mmHg  Pulse 72  Temp(Src) 97.6 F (36.4 C) (Oral)  Resp 18  Ht 5\' 9"  (1.753 m)  Wt 150 lb (68.04 kg)  BMI 22.14 kg/m2  SpO2 100%   Physical Exam General: Well-developed, well-nourished male in no acute distress; appearance consistent with age of record HENT: normocephalic; atraumatic Eyes: pupils equal, round and reactive to light; extraocular muscles intact Neck: supple Heart: regular rate and rhythm; no murmurs, rubs or gallops Lungs: clear to auscultation bilaterally Abdomen: soft; nondistended; nontender; no masses or hepatosplenomegaly; bowel sounds present Extremities: No deformity; full range of motion except right hip; pulses normal Neurologic: Awake, alert and oriented; motor function intact in all extremities and symmetric; no facial droop Skin: Warm and dry Psychiatric: Normal mood and affect Back: right paralumbar tenderness; tenderness over left buttock; positive straight leg raise on right at 20 degrees; negative straight leg raise on the left  ED Course  Procedures (including critical  care time)  DIAGNOSTIC STUDIES: Oxygen Saturation is 100% on room air, normal by my interpretation.    COORDINATION OF CARE: 11:06 PM Discussed treatment plan with pt at bedside and pt agreed to plan.    MDM   Final diagnoses:  Acute low back pain   I personally performed the services described in this documentation, which was scribed in my presence. The recorded information has been reviewed and is accurate.    Paula Libra, MD 12/10/14  2325

## 2014-12-12 ENCOUNTER — Emergency Department (HOSPITAL_COMMUNITY)
Admission: EM | Admit: 2014-12-12 | Discharge: 2014-12-12 | Disposition: A | Discharge status: Home / Self Care | Payer: Self-pay | Attending: Emergency Medicine | Admitting: Emergency Medicine

## 2014-12-12 ENCOUNTER — Emergency Department (HOSPITAL_COMMUNITY): Payer: Self-pay

## 2014-12-12 ENCOUNTER — Encounter (HOSPITAL_COMMUNITY): Payer: Self-pay | Admitting: Emergency Medicine

## 2014-12-12 DIAGNOSIS — Z720 Tobacco use: Secondary | ICD-10-CM | POA: Insufficient documentation

## 2014-12-12 DIAGNOSIS — R109 Unspecified abdominal pain: Secondary | ICD-10-CM | POA: Insufficient documentation

## 2014-12-12 DIAGNOSIS — Z9889 Other specified postprocedural states: Secondary | ICD-10-CM | POA: Insufficient documentation

## 2014-12-12 DIAGNOSIS — M5432 Sciatica, left side: Secondary | ICD-10-CM | POA: Insufficient documentation

## 2014-12-12 DIAGNOSIS — Z8701 Personal history of pneumonia (recurrent): Secondary | ICD-10-CM | POA: Insufficient documentation

## 2014-12-12 LAB — URINALYSIS, ROUTINE W REFLEX MICROSCOPIC
Bilirubin Urine: NEGATIVE
Glucose, UA: NEGATIVE mg/dL
HGB URINE DIPSTICK: NEGATIVE
Ketones, ur: NEGATIVE mg/dL
Leukocytes, UA: NEGATIVE
NITRITE: NEGATIVE
PROTEIN: NEGATIVE mg/dL
Specific Gravity, Urine: 1.006 (ref 1.005–1.030)
UROBILINOGEN UA: 0.2 mg/dL (ref 0.0–1.0)
pH: 7 (ref 5.0–8.0)

## 2014-12-12 LAB — COMPREHENSIVE METABOLIC PANEL
ALBUMIN: 4.6 g/dL (ref 3.5–5.0)
ALK PHOS: 43 U/L (ref 38–126)
ALT: 16 U/L — ABNORMAL LOW (ref 17–63)
ANION GAP: 5 (ref 5–15)
AST: 23 U/L (ref 15–41)
BUN: 14 mg/dL (ref 6–20)
CALCIUM: 9.5 mg/dL (ref 8.9–10.3)
CHLORIDE: 107 mmol/L (ref 101–111)
CO2: 30 mmol/L (ref 22–32)
Creatinine, Ser: 0.89 mg/dL (ref 0.61–1.24)
GFR calc non Af Amer: >60 mL/min (ref >60)
Glucose, Bld: 77 mg/dL (ref 65–99)
POTASSIUM: 4 mmol/L (ref 3.5–5.1)
Sodium: 142 mmol/L (ref 135–145)
Total Bilirubin: 0.6 mg/dL (ref 0.3–1.2)
Total Protein: 7.1 g/dL (ref 6.5–8.1)

## 2014-12-12 LAB — CBC WITH DIFFERENTIAL/PLATELET
Basophils Absolute: 0.1 10*3/uL (ref 0.0–0.1)
Basophils Relative: 1 %
EOS PCT: 9 %
Eosinophils Absolute: 0.6 10*3/uL (ref 0.0–0.7)
HCT: 41.5 % (ref 39.0–52.0)
Hemoglobin: 14.8 g/dL (ref 13.0–17.0)
LYMPHS ABS: 3.5 10*3/uL (ref 0.7–4.0)
Lymphocytes Relative: 48 %
MCH: 33.6 pg (ref 26.0–34.0)
MCHC: 35.7 g/dL (ref 30.0–36.0)
MCV: 94.1 fL (ref 78.0–100.0)
MONOS PCT: 7 %
Monocytes Absolute: 0.5 10*3/uL (ref 0.1–1.0)
Neutro Abs: 2.5 10*3/uL (ref 1.7–7.7)
Neutrophils Relative %: 35 %
PLATELETS: 149 10*3/uL — AB (ref 150–400)
RBC: 4.41 MIL/uL (ref 4.22–5.81)
RDW: 13 % (ref 11.5–15.5)
WBC: 7.2 10*3/uL (ref 4.0–10.5)

## 2014-12-12 LAB — LIPASE, BLOOD: Lipase: 31 U/L (ref 11–51)

## 2014-12-12 MED ORDER — DIAZEPAM 5 MG/ML IJ SOLN
10.0000 mg | Freq: Once | INTRAMUSCULAR | Status: AC
Start: 2014-12-12 — End: 2014-12-12
  Administered 2014-12-12: 10 mg via INTRAVENOUS
  Filled 2014-12-12: qty 2

## 2014-12-12 MED ORDER — HYDROMORPHONE HCL 1 MG/ML IJ SOLN
1.0000 mg | Freq: Once | INTRAMUSCULAR | Status: AC
  Administered 2014-12-12: 1 mg via INTRAVENOUS
  Filled 2014-12-12: qty 1

## 2014-12-12 MED ORDER — IBUPROFEN 600 MG PO TABS: 600.0000 mg | ORAL_TABLET | Freq: Four times a day (QID) | ORAL | Status: DC | PRN

## 2014-12-12 MED ORDER — IOHEXOL 300 MG/ML  SOLN
100.0000 mL | Freq: Once | INTRAMUSCULAR | Status: AC | PRN
  Administered 2014-12-12: 100 mL via INTRAVENOUS

## 2014-12-12 MED ORDER — DEXAMETHASONE SODIUM PHOSPHATE 10 MG/ML IJ SOLN
10.0000 mg | Freq: Once | INTRAMUSCULAR | Status: AC
  Administered 2014-12-12: 10 mg via INTRAVENOUS
  Filled 2014-12-12: qty 1

## 2014-12-12 MED ORDER — ONDANSETRON HCL 4 MG/2ML IJ SOLN
4.0000 mg | Freq: Once | INTRAMUSCULAR | Status: AC
  Administered 2014-12-12: 4 mg via INTRAVENOUS
  Filled 2014-12-12: qty 2

## 2014-12-12 MED ORDER — IOHEXOL 300 MG/ML  SOLN
25.0000 mL | Freq: Once | INTRAMUSCULAR | Status: AC | PRN
  Administered 2014-12-12: 25 mL via ORAL

## 2014-12-12 MED ORDER — DIAZEPAM 5 MG PO TABS: 5.0000 mg | ORAL_TABLET | Freq: Four times a day (QID) | ORAL | Status: DC | PRN

## 2014-12-12 MED ORDER — HYDROCODONE-ACETAMINOPHEN 5-325 MG PO TABS: 1.0000 | ORAL_TABLET | Freq: Four times a day (QID) | ORAL | Status: DC | PRN

## 2014-12-12 NOTE — ED Provider Notes (Signed)
CSN: [id number]     Arrival date & time 12/12/14  1452 History  By signing my name below, I, Jeffery Cross, attest that this documentation has been prepared under the direction and in the presence of non-physician practitioner, Arthor CaptainAbigail Chakia Counts, PA-C. Electronically Signed: Freida Busmaniana Cross, Scribe. 12/12/2014. 6:04 PM.   Chief Complaint  Patient presents with  . Back Pain   The history is provided by the patient. No language interpreter was used.    HPI Comments:  Jeffery Cross is a 39 y.o. 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 He denies having hard ware in his back.  Pt reports associated lower abdominal pain. He has a history of prostate abscess requiring TURP ~ 1 year ago. He denies IVDA No alleviating factors noted.  Past Medical History  Diagnosis Date  . Pneumonia   . Back pain    Past Surgical History  Procedure Laterality Date  . Back surgery    . Appendectomy    . Transurethral resection of prostate N/A 12/17/2012    Procedure: TRANSURETHRAL RESECTION OF THE PROSTATE (TURP) - Unroofing of prostatic abscess;  Surgeon: Marcine MatarStephen Dahlstedt, MD;  Location: [facility];  Service: Urology;  Laterality: N/A;   History reviewed. No pertinent family history. Social History  Substance Use Topics  . Smoking status: Current Every Day Smoker  . Smokeless tobacco: None  . Alcohol Use: Yes     Comment: occasional    Review of Systems  Constitutional: Negative for fever and chills.  Respiratory: Negative for shortness of breath.   Cardiovascular: Negative for chest pain.  Gastrointestinal: Positive for abdominal pain.  Musculoskeletal: Positive for back pain.  All other systems reviewed and are negative.  Allergies   Review of patient's allergies indicates no known allergies.  Home Medications   Prior to Admission medications   Medication Sig Start Date End Date Taking? Authorizing Provider  naproxen sodium (ANAPROX DS) 550 MG tablet Take 1 tablet every 12 hours for pain. Best taken with a meal. 12/10/14   Paula LibraJohn Molpus, MD  oxyCODONE-acetaminophen (PERCOCET) 5-325 MG tablet Take 1-2 tablets by mouth every 6 (six) hours as needed (for pain). 12/10/14   John Molpus, MD   BP 122/68 mmHg  Pulse 65  Temp(Src) 97.9 F (36.6 C) (Oral)  Ht 5\' 10"  (1.778 m)  Wt 150 lb (68.04 kg)  BMI 21.52 kg/m2  SpO2 100% Physical Exam  Constitutional: He is oriented to person, place, and time. He appears well-developed and well-nourished. No distress.  HENT:  Head: Normocephalic and atraumatic.  Eyes: Conjunctivae are normal.  Cardiovascular: Normal rate, regular rhythm and normal heart sounds.   Pulmonary/Chest: Effort normal and breath sounds normal. No respiratory distress.  Abdominal: He exhibits no distension.  Bilateral flank pain radiates into bilateral lower abdomen unable to flex right hip without severe abdominal pain   Musculoskeletal:  FROM of left hip   Neurological: He is alert and oriented to person, place, and time. He has normal reflexes.  Skin: Skin is warm and dry.  Psychiatric: He has a normal mood and affect.  Nursing note and vitals reviewed.   ED Course  Procedures   DIAGNOSTIC STUDIES:  Oxygen Saturation is 100% on RA, normal  by my interpretation.    COORDINATION OF CARE:  5:01 PM Discussed treatment plan with pt at bedside and pt agreed to plan.  Labs Review Labs Reviewed  CBC WITH DIFFERENTIAL/PLATELET - Abnormal; Notable for the following:    Platelets 149 (*)    All other components within normal limits  COMPREHENSIVE METABOLIC PANEL - Abnormal; Notable for the following:    ALT 16 (*)    All other components within normal limits  URINALYSIS, ROUTINE W REFLEX MICROSCOPIC  (NOT AT [facility]) - Abnormal; Notable for the following:    APPearance CLOUDY (*)    All other components within normal limits  LIPASE, BLOOD    Imaging Review No results found. I have personally reviewed and evaluated these images and lab results as part of my medical decision-making.   EKG Interpretation None      MDM   Final diagnoses:  None    Patient with back pain, abdominal pain. Unable to flex The R hip due to pain. Hx of Renal and prostate abscess. Will move to higher acuity for evaluation given his history of abscess.   I personally performed the services described in this documentation, which was scribed in my presence. The recorded information has been reviewed and is accurate.          Arthor Captain, PA-C 12/12/14 1947  Leta Baptist, MD 12/13/14 [phone]

## 2014-12-12 NOTE — ED Notes (Signed)
Pt states that he has had back problems before and feels like this is a reoccurance of that. [facility] and was given percocet but states that it is not working. Alert and oriented.

## 2014-12-12 NOTE — Discharge Instructions (Signed)
Take the medications as prescribed for your back.  Follow up outpatient as directed.  Return with weakness, loss of sensation, inability to urinate, fever.  Sciatica Sciatica is pain, weakness, numbness, or tingling along the path of the sciatic nerve. The nerve starts in the lower back and runs down the back of each leg. The nerve controls the muscles in the lower leg and in the back of the knee, while also providing sensation to the back of the thigh, lower leg, and the sole of your foot. Sciatica is a symptom of another medical condition. For instance, nerve damage or certain conditions, such as a herniated disk or bone spur on the spine, pinch or put pressure on the sciatic nerve. This causes the pain, weakness, or other sensations normally associated with sciatica. Generally, sciatica only affects one side of the body. CAUSES   Herniated or slipped disc.  Degenerative disk disease.  A pain disorder involving the narrow muscle in the buttocks (piriformis syndrome).  Pelvic injury or fracture.  Pregnancy.  Tumor (rare). SYMPTOMS  Symptoms can vary from mild to very severe. The symptoms usually travel from the low back to the buttocks and down the back of the leg. Symptoms can include:  Mild tingling or dull aches in the lower back, leg, or hip.  Numbness in the back of the calf or sole of the foot.  Burning sensations in the lower back, leg, or hip.  Sharp pains in the lower back, leg, or hip.  Leg weakness.  Severe back pain inhibiting movement. These symptoms may get worse with coughing, sneezing, laughing, or prolonged sitting or standing. Also, being overweight may worsen symptoms. DIAGNOSIS  Your caregiver will perform a physical exam to look for common symptoms of sciatica. He or she may ask you to do certain movements or activities that would trigger sciatic nerve pain. Other tests may be performed to find the cause of the sciatica. These may include:  Blood  tests.  X-rays.  Imaging tests, such as an MRI or CT scan. TREATMENT  Treatment is directed at the cause of the sciatic pain. Sometimes, treatment is not necessary and the pain and discomfort goes away on its own. If treatment is needed, your caregiver may suggest:  Over-the-counter medicines to relieve pain.  Prescription medicines, such as anti-inflammatory medicine, muscle relaxants, or narcotics.  Applying heat or ice to the painful area.  Steroid injections to lessen pain, irritation, and inflammation around the nerve.  Reducing activity during periods of pain.  Exercising and stretching to strengthen your abdomen and improve flexibility of your spine. Your caregiver may suggest losing weight if the extra weight makes the back pain worse.  Physical therapy.  Surgery to eliminate what is pressing or pinching the nerve, such as a bone spur or part of a herniated disk. HOME CARE INSTRUCTIONS   Only take over-the-counter or prescription medicines for pain or discomfort as directed by your caregiver.  Apply ice to the affected area for 20 minutes, 3-4 times a day for the first 48-72 hours. Then try heat in the same way.  Exercise, stretch, or perform your usual activities if these do not aggravate your pain.  Attend physical therapy sessions as directed by your caregiver.  Keep all follow-up appointments as directed by your caregiver.  Do not wear high heels or shoes that do not provide proper support.  Check your mattress to see if it is too soft. A firm mattress may lessen your pain and discomfort. SEEK  IMMEDIATE MEDICAL CARE IF:   You lose control of your bowel or bladder (incontinence).  You have increasing weakness in the lower back, pelvis, buttocks, or legs.  You have redness or swelling of your back.  You have a burning sensation when you urinate.  You have pain that gets worse when you lie down or awakens you at night.  Your pain is worse than you have  experienced in the past.  Your pain is lasting longer than 4 weeks.  You are suddenly losing weight without reason. MAKE SURE YOU:  Understand these instructions.  Will watch your condition.  Will get help right away if you are not doing well or get worse.   This information is not intended to replace advice given to you by your health care provider. Make sure you discuss any questions you have with your health care provider.   Document Released: 01/13/2001 Document Revised: 10/10/2014 Document Reviewed: 05/31/2011 Elsevier Interactive Patient Education 2016 [employer]  Lumbosacral Strain Lumbosacral strain is a strain of any of the parts that make up your lumbosacral vertebrae. Your lumbosacral vertebrae are the bones that make up the lower third of your backbone. Your lumbosacral vertebrae are held together by muscles and tough, fibrous tissue (ligaments).  CAUSES  A sudden blow to your back can cause lumbosacral strain. Also, anything that causes an excessive stretch of the muscles in the low back can cause this strain. This is typically seen when people exert themselves strenuously, fall, lift heavy objects, bend, or crouch repeatedly. RISK FACTORS  Physically demanding work.  Participation in pushing or pulling sports or sports that require a sudden twist of the back (tennis, golf, baseball).  Weight lifting.  Excessive lower back curvature.  Forward-tilted pelvis.  Weak back or abdominal muscles or both.  Tight hamstrings. SIGNS AND SYMPTOMS  Lumbosacral strain may cause pain in the area of your injury or pain that moves (radiates) down your leg.  DIAGNOSIS Your health care provider can often diagnose lumbosacral strain through a physical exam. In some cases, you may need tests such as X-ray exams.  TREATMENT  Treatment for your lower back injury depends on many factors that your clinician will have to evaluate. However, most treatment will include the use of  anti-inflammatory medicines. HOME CARE INSTRUCTIONS   Avoid hard physical activities (tennis, racquetball, waterskiing) if you are not in proper physical condition for it. This may aggravate or create problems.  If you have a back problem, avoid sports requiring sudden body movements. Swimming and walking are generally safer activities.  Maintain good posture.  Maintain a healthy weight.  For acute conditions, you may put ice on the injured area.  Put ice in a plastic bag.  Place a towel between your skin and the bag.  Leave the ice on for 20 minutes, 2-3 times a day.  When the low back starts healing, stretching and strengthening exercises may be recommended. SEEK MEDICAL CARE IF:  Your back pain is getting worse.  You experience severe back pain not relieved with medicines. SEEK IMMEDIATE MEDICAL CARE IF:   You have numbness, tingling, weakness, or problems with the use of your arms or legs.  There is a change in bowel or bladder control.  You have increasing pain in any area of the body, including your belly (abdomen).  You notice shortness of breath, dizziness, or feel faint.  You feel sick to your stomach (nauseous), are throwing up (vomiting), or become sweaty.  You notice discoloration  of your toes or legs, or your feet get very cold. MAKE SURE YOU:   Understand these instructions.  Will watch your condition.  Will get help right away if you are not doing well or get worse.   This information is not intended to replace advice given to you by your health care provider. Make sure you discuss any questions you have with your health care provider.   Document Released: 10/29/2004 Document Revised: 02/09/2014 Document Reviewed: 09/07/2012 Elsevier Interactive Patient Education [employer]

## 2014-12-19 ENCOUNTER — Emergency Department (HOSPITAL_COMMUNITY)
Admission: EM | Admit: 2014-12-19 | Discharge: 2014-12-19 | Disposition: A | Discharge status: Home / Self Care | Payer: Self-pay | Attending: Emergency Medicine | Admitting: Emergency Medicine

## 2014-12-19 ENCOUNTER — Encounter (HOSPITAL_COMMUNITY): Payer: Self-pay | Admitting: Emergency Medicine

## 2014-12-19 DIAGNOSIS — F17210 Nicotine dependence, cigarettes, uncomplicated: Secondary | ICD-10-CM | POA: Insufficient documentation

## 2014-12-19 DIAGNOSIS — Z8701 Personal history of pneumonia (recurrent): Secondary | ICD-10-CM | POA: Insufficient documentation

## 2014-12-19 DIAGNOSIS — M5441 Lumbago with sciatica, right side: Secondary | ICD-10-CM | POA: Insufficient documentation

## 2014-12-19 MED ORDER — KETOROLAC TROMETHAMINE 30 MG/ML IJ SOLN
30.0000 mg | Freq: Once | INTRAMUSCULAR | Status: AC
  Administered 2014-12-19: 30 mg via INTRAVENOUS
  Filled 2014-12-19: qty 1

## 2014-12-19 MED ORDER — OXYCODONE-ACETAMINOPHEN 5-325 MG PO TABS
2.0000 | ORAL_TABLET | Freq: Once | ORAL | Status: AC
  Administered 2014-12-19: 2 via ORAL
  Filled 2014-12-19: qty 2

## 2014-12-19 MED ORDER — METHOCARBAMOL 500 MG PO TABS
500.0000 mg | ORAL_TABLET | Freq: Two times a day (BID) | ORAL | Status: DC
Start: 2014-12-19 — End: 2015-05-26

## 2014-12-19 MED ORDER — CYCLOBENZAPRINE HCL 10 MG PO TABS
5.0000 mg | ORAL_TABLET | Freq: Once | ORAL | Status: AC
  Administered 2014-12-19: 5 mg via ORAL
  Filled 2014-12-19: qty 1

## 2014-12-19 MED ORDER — OXYCODONE-ACETAMINOPHEN 5-325 MG PO TABS: 1.0000 | ORAL_TABLET | ORAL | Status: DC | PRN

## 2014-12-19 NOTE — ED Notes (Addendum)
Patient complains of back pain.  States that two weeks ago he picked up something at work and the pain started, it has gotten worse since then.  States that his hands feel numb sometimes and "it feels like something is moving around back in there".

## 2014-12-19 NOTE — ED Provider Notes (Signed)
CSN: [id number]     Arrival date & time 12/19/14  7829 History   First MD Initiated Contact with Patient 12/19/14 [phone]     Chief Complaint  Patient presents with  . Back Pain     (Consider location/radiation/quality/duration/timing/severity/associated sxs/prior Treatment) Patient is a 39 y.o. male presenting with back pain. The history is provided by the patient. No language interpreter was used.  Back Pain Associated symptoms: no abdominal pain and no fever   Jeffery Cross is a 39 year old male with a history of back pain, back surgery, pneumonia who presents for worsening back pain for the past 2 weeks after lifting something at work. He states he feels something moving around in his back and that it radiates down his right leg. He also states that he had back surgery in 2000 by Dr. Newell Coral but is unaware of what was done in his back. He states he had bone on bone. He also says that he was seen at [facility] long 6 days ago for back pain and given Percocet. He states the Percocet has not touched his pain. He denies any fever, bowel or bladder incontinence or retention, recent prednisone, spinal abscess or IV drug use, lower extremity weakness or numbness, or history of cancer.  Past Medical History  Diagnosis Date  . Pneumonia   . Back pain    Past Surgical History  Procedure Laterality Date  . Back surgery    . Appendectomy    . Transurethral resection of prostate N/A 12/17/2012    Procedure: TRANSURETHRAL RESECTION OF THE PROSTATE (TURP) - Unroofing of prostatic abscess;  Surgeon: Marcine Matar, MD;  Location: [facility];  Service: Urology;  Laterality: N/A;   No family history on file. Social History  Substance Use Topics  . Smoking status: Current Every Day Smoker  . Smokeless tobacco: None  . Alcohol Use: Yes     Comment: occasional    Review of Systems  Constitutional: Negative for fever.  Gastrointestinal: Negative for abdominal pain.  Musculoskeletal: Positive for back  pain.  All other systems reviewed and are negative.     Allergies  Review of patient's allergies indicates no known allergies.  Home Medications   Prior to Admission medications   Medication Sig Start Date End Date Taking? Authorizing Provider  diazepam (VALIUM) 5 MG tablet Take 1 tablet (5 mg total) by mouth every 6 (six) hours as needed for muscle spasms. 12/12/14  Yes Leta Baptist, MD  HYDROcodone-acetaminophen (NORCO) 5-325 MG tablet Take 1 tablet by mouth every 6 (six) hours as needed for moderate pain. 12/12/14  Yes Leta Baptist, MD  ibuprofen (ADVIL,MOTRIN) 600 MG tablet Take 1 tablet (600 mg total) by mouth every 6 (six) hours as needed. Patient taking differently: Take 600 mg by mouth every 6 (six) hours as needed for moderate pain.  12/12/14   Leta Baptist, MD  methocarbamol (ROBAXIN) 500 MG tablet Take 1 tablet (500 mg total) by mouth 2 (two) times daily. 12/19/14   Kortlynn Poust Patel-Mills, PA-C  oxyCODONE-acetaminophen (ROXICET) 5-325 MG tablet Take 1 tablet by mouth every 4 (four) hours as needed for severe pain. 12/19/14   Melford Tullier Patel-Mills, PA-C   BP 105/74 mmHg  Pulse 59  Temp(Src) 97.5 F (36.4 C) (Oral)  Resp 18  SpO2 100% Physical Exam  Constitutional: He is oriented to person, place, and time. He appears well-developed and well-nourished.  HENT:  Head: Normocephalic and atraumatic.  Eyes: Conjunctivae are normal.  Neck: Neck supple.  Cardiovascular:  Normal rate.   Pulmonary/Chest: Effort normal. No respiratory distress.  Abdominal: Soft.  Musculoskeletal: Normal range of motion.  Vertical lumbar surgical scar from prior surgery. No saddle anesthesia. Positive right sided straight leg raise. No midline thoracic or lumbar vertebral tenderness. Right-sided muscle spasms. No rash noted. No lower extremity weakness or numbness. Able to dorsi and plantar flex without difficulty.  Neurological: He is alert and oriented to person, place, and time.  Skin: Skin is  warm and dry.  Psychiatric: He has a normal mood and affect.  Nursing note and vitals reviewed.   ED Course  Procedures (including critical care time) Labs Review Labs Reviewed - No data to display  Imaging Review No results found.   EKG Interpretation None      MDM   Final diagnoses:  Right-sided low back pain with right-sided sciatica  Patient presents for worsening back pain 2 weeks after lifting something at work. 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 He has no red flags including bowel or bladder incontinence or retention, IV drug use, recent prednisone use, history of epidural abscess, numbness or tingling to his lower extremities, or history of cancer. Patient requested x-rays of his spine because they were not done 6 days ago. I discussed that a CT scan of his abdomen was done which shows images of his back. He had degenerative disc disease at L4-L5 and L5-S1 with narrowing of the spinal canal. He also had previous L5 laminectomies. There was no acute osseous abnormality of his lumbar spine. I explained that sometimes this pain does not resolve within a couple of days and may take a couple of weeks. This is most likely sciatic pain with radiation down the right leg. I discussed following up with his spinal surgeon, Dr. Newell CoralNudelman. I also explained that we would treat his pain while in the ED. Patient became argumentative when I explained that he would only get #6 percocet for pain until he can follow up. He stated that he didn't have insurance and cannot follow-up with his neurosurgeon.    Catha GosselinHanna Patel-Mills, PA-C 12/19/14 1634  Linwood DibblesJon Knapp, MD 12/21/14 0900

## 2014-12-19 NOTE — ED Notes (Signed)
PA at bedside.

## 2014-12-19 NOTE — Discharge Instructions (Signed)
Sciatica Follow-up with Dr. Newell CoralNudelman. Take Norco and ibuprofen as prescribed to you previously. I have added Robaxin which is a muscle relaxer. Do not drive or operate heavy machinery while using this medication. Sciatica is pain, weakness, numbness, or tingling along your sciatic nerve. The nerve starts in the lower back and runs down the back of each leg. Nerve damage or certain conditions pinch or put pressure on the sciatic nerve. This causes the pain, weakness, and other discomforts of sciatica. HOME CARE   Only take medicine as told by your doctor.  Apply ice to the affected area for 20 minutes. Do this 3-4 times a day for the first 48-72 hours. Then try heat in the same way.  Exercise, stretch, or do your usual activities if these do not make your pain worse.  Go to physical therapy as told by your doctor.  Keep all doctor visits as told.  Do not wear high heels or shoes that are not supportive.  Get a firm mattress if your mattress is too soft to lessen pain and discomfort. GET HELP RIGHT AWAY IF:   You cannot control when you poop (bowel movement) or pee (urinate).  You have more weakness in your lower back, lower belly (pelvis), butt (buttocks), or legs.  You have redness or puffiness (swelling) of your back.  You have a burning feeling when you pee.  You have pain that gets worse when you lie down.  You have pain that wakes you from your sleep.  Your pain is worse than past pain.  Your pain lasts longer than 4 weeks.  You are suddenly losing weight without reason. MAKE SURE YOU:   Understand these instructions.  Will watch this condition.  Will get help right away if you are not doing well or get worse.   This information is not intended to replace advice given to you by your health care provider. Make sure you discuss any questions you have with your health care provider.   Document Released: 10/29/2007 Document Revised: 10/10/2014 Document Reviewed:  05/31/2011 Elsevier Interactive Patient Education [employer]

## 2015-01-09 ENCOUNTER — Inpatient Hospital Stay: Payer: Self-pay | Admitting: Family Medicine

## 2015-05-25 ENCOUNTER — Emergency Department (HOSPITAL_COMMUNITY)
Admission: EM | Admit: 2015-05-25 | Discharge: 2015-05-26 | Disposition: A | Discharge status: Home / Self Care | Payer: No Typology Code available for payment source | Attending: Emergency Medicine | Admitting: Emergency Medicine

## 2015-05-25 DIAGNOSIS — S80212A Abrasion, left knee, initial encounter: Secondary | ICD-10-CM | POA: Insufficient documentation

## 2015-05-25 DIAGNOSIS — Y998 Other external cause status: Secondary | ICD-10-CM | POA: Insufficient documentation

## 2015-05-25 DIAGNOSIS — Y9389 Activity, other specified: Secondary | ICD-10-CM | POA: Diagnosis not present

## 2015-05-25 DIAGNOSIS — Z79899 Other long term (current) drug therapy: Secondary | ICD-10-CM | POA: Insufficient documentation

## 2015-05-25 DIAGNOSIS — Z8701 Personal history of pneumonia (recurrent): Secondary | ICD-10-CM | POA: Diagnosis not present

## 2015-05-25 DIAGNOSIS — S0101XA Laceration without foreign body of scalp, initial encounter: Secondary | ICD-10-CM | POA: Diagnosis not present

## 2015-05-25 DIAGNOSIS — S0081XA Abrasion of other part of head, initial encounter: Secondary | ICD-10-CM

## 2015-05-25 DIAGNOSIS — S0993XA Unspecified injury of face, initial encounter: Secondary | ICD-10-CM | POA: Diagnosis present

## 2015-05-25 DIAGNOSIS — Y92410 Unspecified street and highway as the place of occurrence of the external cause: Secondary | ICD-10-CM | POA: Diagnosis not present

## 2015-05-25 DIAGNOSIS — S40011A Contusion of right shoulder, initial encounter: Secondary | ICD-10-CM

## 2015-05-25 DIAGNOSIS — F17200 Nicotine dependence, unspecified, uncomplicated: Secondary | ICD-10-CM | POA: Insufficient documentation

## 2015-05-25 DIAGNOSIS — S80211A Abrasion, right knee, initial encounter: Secondary | ICD-10-CM | POA: Diagnosis not present
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
## 2015-05-26 ENCOUNTER — Encounter (HOSPITAL_COMMUNITY): Payer: Self-pay | Admitting: Emergency Medicine

## 2015-05-26 ENCOUNTER — Emergency Department (HOSPITAL_COMMUNITY): Payer: No Typology Code available for payment source

## 2015-05-26 MED ORDER — OXYCODONE-ACETAMINOPHEN 5-325 MG PO TABS: 1.0000 | ORAL_TABLET | ORAL | Status: DC | PRN

## 2015-05-26 MED ORDER — LIDOCAINE-EPINEPHRINE 2 %-1:100000 IJ SOLN
20.0000 mL | Freq: Once | INTRAMUSCULAR | Status: AC
  Administered 2015-05-26: 20 mL
  Filled 2015-05-26 (×2): qty 20

## 2015-05-26 MED ORDER — OXYCODONE-ACETAMINOPHEN 5-325 MG PO TABS
1.0000 | ORAL_TABLET | Freq: Once | ORAL | Status: AC
  Administered 2015-05-26: 1 via ORAL
  Filled 2015-05-26: qty 1
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
## 2015-05-26 NOTE — Discharge Instructions (Signed)
Take ibuprofen or acetaminophen as needed for less severe pain.  Please go to your Primary Care Physician, an Urgent Care or return to the Emergency Department to have your staples or sutures removed 7 -10 days from today.  Laceration Care, Adult A laceration is a cut that goes through all of the layers of the skin and into the tissue that is right under the skin. Some lacerations heal on their own. Others need to be closed with stitches (sutures), staples, skin adhesive strips, or skin glue. Proper laceration care minimizes the risk of infection and helps the laceration to heal better. HOW TO CARE FOR YOUR LACERATION If sutures or staples were used:  Keep the wound clean and dry.  If you were given a bandage (dressing), you should change it at least one time per day or as told by your health care provider. You should also change it if it becomes wet or dirty.  Keep the wound completely dry for the first 24 hours or as told by your health care provider. After that time, you may shower or bathe. However, make sure that the wound is not soaked in water until after the sutures or staples have been removed.  Clean the wound one time each day or as told by your health care provider:  Wash the wound with soap and water.  Rinse the wound with water to remove all soap.  Pat the wound dry with a clean towel. Do not rub the wound.  After cleaning the wound, apply a thin layer of antibiotic ointmentas told by your health care provider. This will help to prevent infection and keep the dressing from sticking to the wound.  Have the sutures or staples removed as told by your health care provider. If skin adhesive strips were used:  Keep the wound clean and dry.  If you were given a bandage (dressing), you should change it at least one time per day or as told by your health care provider. You should also change it if it becomes dirty or wet.  Do not get the skin adhesive strips wet. You may shower  or bathe, but be careful to keep the wound dry.  If the wound gets wet, pat it dry with a clean towel. Do not rub the wound.  Skin adhesive strips fall off on their own. You may trim the strips as the wound heals. Do not remove skin adhesive strips that are still stuck to the wound. They will fall off in time. If skin glue was used:  Try to keep the wound dry, but you may briefly wet it in the shower or bath. Do not soak the wound in water, such as by swimming.  After you have showered or bathed, gently pat the wound dry with a clean towel. Do not rub the wound.  Do not do any activities that will make you sweat heavily until the skin glue has fallen off on its own.  Do not apply liquid, cream, or ointment medicine to the wound while the skin glue is in place. Using those may loosen the film before the wound has healed.  If you were given a bandage (dressing), you should change it at least one time per day or as told by your health care provider. You should also change it if it becomes dirty or wet.  If a dressing is placed over the wound, be careful not to apply tape directly over the skin glue. Doing that may cause the glue  to be pulled off before the wound has healed.  Do not pick at the glue. The skin glue usually remains in place for 5-10 days, then it falls off of the skin. General Instructions  Take over-the-counter and prescription medicines only as told by your health care provider.  If you were prescribed an antibiotic medicine or ointment, take or apply it as told by your doctor. Do not stop using it even if your condition improves.  To help prevent scarring, make sure to cover your wound with sunscreen whenever you are outside after stitches are removed, after adhesive strips are removed, or when glue remains in place and the wound is healed. Make sure to wear a sunscreen of at least 30 SPF.  Do not scratch or pick at the wound.  Keep all follow-up visits as told by your  health care provider. This is important.  Check your wound every day for signs of infection. Watch for:  Redness, swelling, or pain.  Fluid, blood, or pus.  Raise (elevate) the injured area above the level of your heart while you are sitting or lying down, if possible. SEEK MEDICAL CARE IF:  You received a tetanus shot and you have swelling, severe pain, redness, or bleeding at the injection site.  You have a fever.  A wound that was closed breaks open.  You notice a bad smell coming from your wound or your dressing.  You notice something coming out of the wound, such as wood or glass.  Your pain is not controlled with medicine.  You have increased redness, swelling, or pain at the site of your wound.  You have fluid, blood, or pus coming from your wound.  You notice a change in the color of your skin near your wound.  You need to change the dressing frequently due to fluid, blood, or pus draining from the wound.  You develop a new rash.  You develop numbness around the wound. SEEK IMMEDIATE MEDICAL CARE IF:  You develop severe swelling around the wound.  Your pain suddenly increases and is severe.  You develop painful lumps near the wound or on skin that is anywhere on your body.  You have a red streak going away from your wound.  The wound is on your hand or foot and you cannot properly move a finger or toe.  The wound is on your hand or foot and you notice that your fingers or toes look pale or bluish.   This information is not intended to replace advice given to you by your health care provider. Make sure you discuss any questions you have with your health care provider.   Document Released: 01/19/2005 Document Revised: 06/05/2014 Document Reviewed: 01/15/2014 Elsevier Interactive Patient Education 2016 [employer]   Abrasion An abrasion is a cut or scrape on the outer surface of your skin. An abrasion does not extend through all of the layers of your  skin. It is important to care for your abrasion properly to prevent infection. CAUSES Most abrasions are caused by falling on or gliding across the ground or another surface. When your skin rubs on something, the outer and inner layer of skin rubs off.  SYMPTOMS A cut or scrape is the main symptom of this condition. The scrape may be bleeding, or it may appear red or pink. If there was an associated fall, there may be an underlying bruise. DIAGNOSIS An abrasion is diagnosed with a physical exam. TREATMENT Treatment for this condition depends on how  large and deep the abrasion is. Usually, your abrasion will be cleaned with water and mild soap. This removes any dirt or debris that may be stuck. An antibiotic ointment may be applied to the abrasion to help prevent infection. A bandage (dressing) may be placed on the abrasion to keep it clean. You may also need a tetanus shot. HOME CARE INSTRUCTIONS Medicines  Take or apply medicines only as directed by your health care provider.  If you were prescribed an antibiotic ointment, finish all of it even if you start to feel better. Wound Care  Clean the wound with mild soap and water 2-3 times per day or as directed by your health care provider. Pat your wound dry with a clean towel. Do not rub it.  There are many different ways to close and cover a wound. Follow instructions from your health care provider about:  Wound care.  Dressing changes and removal.  Check your wound every day for signs of infection. Watch for:  Redness, swelling, or pain.  Fluid, blood, or pus. General Instructions  Keep the dressing dry as directed by your health care provider. Do not take baths, swim, use a hot tub, or do anything that would put your wound underwater until your health care provider approves.  If there is swelling, raise (elevate) the injured area above the level of your heart while you are sitting or lying down.  Keep all follow-up visits as  directed by your health care provider. This is important. SEEK MEDICAL CARE IF:  You received a tetanus shot and you have swelling, severe pain, redness, or bleeding at the injection site.  Your pain is not controlled with medicine.  You have increased redness, swelling, or pain at the site of your wound. SEEK IMMEDIATE MEDICAL CARE IF:  You have a red streak going away from your wound.  You have a fever.  You have fluid, blood, or pus coming from your wound.  You notice a bad smell coming from your wound or your dressing.   This information is not intended to replace advice given to you by your health care provider. Make sure you discuss any questions you have with your health care provider.   Document Released: 10/29/2004 Document Revised: 10/10/2014 Document Reviewed: 01/17/2014 Elsevier Interactive Patient Education 2016 [employer]   Contusion A contusion is a deep bruise. Contusions are the result of a blunt injury to tissues and muscle fibers under the skin. The injury causes bleeding under the skin. The skin overlying the contusion may turn blue, purple, or yellow. Minor injuries will give you a painless contusion, but more severe contusions may stay painful and swollen for a few weeks.  CAUSES  This condition is usually caused by a blow, trauma, or direct force to an area of the body. SYMPTOMS  Symptoms of this condition include:  Swelling of the injured area.  Pain and tenderness in the injured area.  Discoloration. The area may have redness and then turn blue, purple, or yellow. DIAGNOSIS  This condition is diagnosed based on a physical exam and medical history. An X-ray, CT scan, or MRI may be needed to determine if there are any associated injuries, such as broken bones (fractures). TREATMENT  Specific treatment for this condition depends on what area of the body was injured. In general, the best treatment for a contusion is resting, icing, applying pressure to  (compression), and elevating the injured area. This is often called the RICE strategy. Over-the-counter anti-inflammatory medicines  may also be recommended for pain control.  HOME CARE INSTRUCTIONS   Rest the injured area.  If directed, apply ice to the injured area:  Put ice in a plastic bag.  Place a towel between your skin and the bag.  Leave the ice on for 20 minutes, 2-3 times per day.  If directed, apply light compression to the injured area using an elastic bandage. Make sure the bandage is not wrapped too tightly. Remove and reapply the bandage as directed by your health care provider.  If possible, raise (elevate) the injured area above the level of your heart while you are sitting or lying down.  Take over-the-counter and prescription medicines only as told by your health care provider. SEEK MEDICAL CARE IF:  Your symptoms do not improve after several days of treatment.  Your symptoms get worse.  You have difficulty moving the injured area. SEEK IMMEDIATE MEDICAL CARE IF:   You have severe pain.  You have numbness in a hand or foot.  Your hand or foot turns pale or cold.   This information is not intended to replace advice given to you by your health care provider. Make sure you discuss any questions you have with your health care provider.   Document Released: 10/29/2004 Document Revised: 10/10/2014 Document Reviewed: 06/06/2014 Elsevier Interactive Patient Education 2016 [employer]  Acetaminophen; Oxycodone tablets What is this medicine? ACETAMINOPHEN; OXYCODONE (a set a MEE noe fen; ox i KOE done) is a pain reliever. It is used to treat moderate to severe pain. This medicine may be used for other purposes; ask your health care provider or pharmacist if you have questions. What should I tell my health care provider before I take this medicine? They need to know if you have any of these conditions: -brain tumor -Crohn's disease, inflammatory bowel disease,  or ulcerative colitis -drug abuse or addiction -head injury -heart or circulation problems -if you often drink alcohol -kidney disease or problems going to the bathroom -liver disease -lung disease, asthma, or breathing problems -an unusual or allergic reaction to acetaminophen, oxycodone, other opioid analgesics, other medicines, foods, dyes, or preservatives -pregnant or trying to get pregnant -breast-feeding How should I use this medicine? Take this medicine by mouth with a full glass of water. Follow the directions on the prescription label. You can take it with or without food. If it upsets your stomach, take it with food. Take your medicine at regular intervals. Do not take it more often than directed. Talk to your pediatrician regarding the use of this medicine in children. Special care may be needed. Patients over 20 years old may have a stronger reaction and need a smaller dose. Overdosage: If you think you have taken too much of this medicine contact a poison control center or emergency room at once. NOTE: This medicine is only for you. Do not share this medicine with others. What if I miss a dose? If you miss a dose, take it as soon as you can. If it is almost time for your next dose, take only that dose. Do not take double or extra doses. What may interact with this medicine? -alcohol -antihistamines -barbiturates like amobarbital, butalbital, butabarbital, methohexital, pentobarbital, phenobarbital, thiopental, and secobarbital -benztropine -drugs for bladder problems like solifenacin, trospium, oxybutynin, tolterodine, hyoscyamine, and methscopolamine -drugs for breathing problems like ipratropium and tiotropium -drugs for certain stomach or intestine problems like propantheline, homatropine methylbromide, glycopyrrolate, atropine, belladonna, and dicyclomine -general anesthetics like etomidate, ketamine, nitrous oxide, propofol, desflurane, enflurane,  halothane, isoflurane,  and sevoflurane -medicines for depression, anxiety, or psychotic disturbances -medicines for sleep -muscle relaxants -naltrexone -narcotic medicines (opiates) for pain -phenothiazines like perphenazine, thioridazine, chlorpromazine, mesoridazine, fluphenazine, prochlorperazine, promazine, and trifluoperazine -scopolamine -tramadol -trihexyphenidyl This list may not describe all possible interactions. Give your health care provider a list of all the medicines, herbs, non-prescription drugs, or dietary supplements you use. Also tell them if you smoke, drink alcohol, or use illegal drugs. Some items may interact with your medicine. What should I watch for while using this medicine? Tell your doctor or health care professional if your pain does not go away, if it gets worse, or if you have new or a different type of pain. You may develop tolerance to the medicine. Tolerance means that you will need a higher dose of the medication for pain relief. Tolerance is normal and is expected if you take this medicine for a long time. Do not suddenly stop taking your medicine because you may develop a severe reaction. Your body becomes used to the medicine. This does NOT mean you are addicted. Addiction is a behavior related to getting and using a drug for a non-medical reason. If you have pain, you have a medical reason to take pain medicine. Your doctor will tell you how much medicine to take. If your doctor wants you to stop the medicine, the dose will be slowly lowered over time to avoid any side effects. You may get drowsy or dizzy. Do not drive, use machinery, or do anything that needs mental alertness until you know how this medicine affects you. Do not stand or sit up quickly, especially if you are an older patient. This reduces the risk of dizzy or fainting spells. Alcohol may interfere with the effect of this medicine. Avoid alcoholic drinks. There are different types of narcotic medicines (opiates) for  pain. If you take more than one type at the same time, you may have more side effects. Give your health care provider a list of all medicines you use. Your doctor will tell you how much medicine to take. Do not take more medicine than directed. Call emergency for help if you have problems breathing. The medicine will cause constipation. Try to have a bowel movement at least every 2 to 3 days. If you do not have a bowel movement for 3 days, call your doctor or health care professional. Do not take Tylenol (acetaminophen) or medicines that have acetaminophen with this medicine. Too much acetaminophen can be very dangerous. Many nonprescription medicines contain acetaminophen. Always read the labels carefully to avoid taking more acetaminophen. What side effects may I notice from receiving this medicine? Side effects that you should report to your doctor or health care professional as soon as possible: -allergic reactions like skin rash, itching or hives, swelling of the face, lips, or tongue -breathing difficulties, wheezing -confusion -light headedness or fainting spells -severe stomach pain -unusually weak or tired -yellowing of the skin or the whites of the eyes Side effects that usually do not require medical attention (report to your doctor or health care professional if they continue or are bothersome): -dizziness -drowsiness -nausea -vomiting This list may not describe all possible side effects. Call your doctor for medical advice about side effects. You may report side effects to FDA at 1-800-FDA-1088. Where should I keep my medicine? Keep out of the reach of children. This medicine can be abused. Keep your medicine in a safe place to protect it from theft. Do not share this  medicine with anyone. Selling or giving away this medicine is dangerous and against the law. This medicine may cause accidental overdose and death if it taken by other adults, children, or pets. Mix any unused medicine  with a substance like cat litter or coffee grounds. Then throw the medicine away in a sealed container like a sealed bag or a coffee can with a lid. Do not use the medicine after the expiration date. Store at room temperature between 20 and 25 degrees C (68 and 77 degrees F). NOTE: This sheet is a summary. It may not cover all possible information. If you have questions about this medicine, talk to your doctor, pharmacist, or health care provider.    2016, [employer]. (2013-12-20 15:18:46)

## 2015-05-26 NOTE — ED Notes (Signed)
MD at bedside. 

## 2015-05-26 NOTE — ED Notes (Signed)
Suture cart at beside

## 2016-09-17 ENCOUNTER — Emergency Department (HOSPITAL_BASED_OUTPATIENT_CLINIC_OR_DEPARTMENT_OTHER)
Admission: EM | Admit: 2016-09-17 | Discharge: 2016-09-18 | Discharge status: Against Medical Advice | Payer: Self-pay | Attending: Emergency Medicine | Admitting: Emergency Medicine

## 2016-09-17 ENCOUNTER — Encounter (HOSPITAL_BASED_OUTPATIENT_CLINIC_OR_DEPARTMENT_OTHER): Payer: Self-pay

## 2016-09-17 DIAGNOSIS — R112 Nausea with vomiting, unspecified: Secondary | ICD-10-CM

## 2016-09-17 DIAGNOSIS — N179 Acute kidney failure, unspecified: Secondary | ICD-10-CM | POA: Insufficient documentation

## 2016-09-17 DIAGNOSIS — F17200 Nicotine dependence, unspecified, uncomplicated: Secondary | ICD-10-CM | POA: Insufficient documentation

## 2016-09-17 LAB — CBC
HEMATOCRIT: 48.1 % (ref 39.0–52.0)
HEMOGLOBIN: 17.8 g/dL — AB (ref 13.0–17.0)
MCH: 33.4 pg (ref 26.0–34.0)
MCHC: 37 g/dL — ABNORMAL HIGH (ref 30.0–36.0)
MCV: 90.2 fL (ref 78.0–100.0)
Platelets: 226 10*3/uL (ref 150–400)
RBC: 5.33 MIL/uL (ref 4.22–5.81)
RDW: 13.3 % (ref 11.5–15.5)
WBC: 23.6 10*3/uL — AB (ref 4.0–10.5)

## 2016-09-17 LAB — COMPREHENSIVE METABOLIC PANEL
ALT: 29 U/L (ref 17–63)
AST: 44 U/L — ABNORMAL HIGH (ref 15–41)
Albumin: 6 g/dL — ABNORMAL HIGH (ref 3.5–5.0)
Alkaline Phosphatase: 49 U/L (ref 38–126)
Anion gap: 18 — ABNORMAL HIGH (ref 5–15)
BUN: 28 mg/dL — ABNORMAL HIGH (ref 6–20)
CO2: 25 mmol/L (ref 22–32)
Calcium: 10.8 mg/dL — ABNORMAL HIGH (ref 8.9–10.3)
Chloride: 100 mmol/L — ABNORMAL LOW (ref 101–111)
Creatinine, Ser: 3.38 mg/dL — ABNORMAL HIGH (ref 0.61–1.24)
GFR, EST AFRICAN AMERICAN: 25 mL/min — AB (ref >60)
GFR, EST NON AFRICAN AMERICAN: 21 mL/min — AB (ref >60)
Glucose, Bld: 117 mg/dL — ABNORMAL HIGH (ref 65–99)
POTASSIUM: 4.8 mmol/L (ref 3.5–5.1)
Sodium: 143 mmol/L (ref 135–145)
Total Bilirubin: 1.5 mg/dL — ABNORMAL HIGH (ref 0.3–1.2)
Total Protein: 9.1 g/dL — ABNORMAL HIGH (ref 6.5–8.1)

## 2016-09-17 LAB — BASIC METABOLIC PANEL
Anion gap: 13 (ref 5–15)
BUN: 26 mg/dL — AB (ref 6–20)
CHLORIDE: 105 mmol/L (ref 101–111)
CO2: 25 mmol/L (ref 22–32)
CREATININE: 3.02 mg/dL — AB (ref 0.61–1.24)
Calcium: 9.3 mg/dL (ref 8.9–10.3)
GFR calc Af Amer: 28 mL/min — ABNORMAL LOW (ref >60)
GFR calc non Af Amer: 24 mL/min — ABNORMAL LOW (ref >60)
Glucose, Bld: 107 mg/dL — ABNORMAL HIGH (ref 65–99)
Potassium: 4.5 mmol/L (ref 3.5–5.1)
SODIUM: 143 mmol/L (ref 135–145)

## 2016-09-17 LAB — URINALYSIS, ROUTINE W REFLEX MICROSCOPIC
GLUCOSE, UA: NEGATIVE mg/dL
Hgb urine dipstick: NEGATIVE
Ketones, ur: 15 mg/dL — AB
Nitrite: NEGATIVE
PH: 5 (ref 5.0–8.0)
PROTEIN: 30 mg/dL — AB
Specific Gravity, Urine: 1.027 (ref 1.005–1.030)

## 2016-09-17 LAB — URINALYSIS, MICROSCOPIC (REFLEX): RBC / HPF: NONE SEEN RBC/hpf (ref 0–5)

## 2016-09-17 LAB — CK: CK TOTAL: 719 U/L — AB (ref 49–397)

## 2016-09-17 LAB — LIPASE, BLOOD: LIPASE: 35 U/L (ref 11–51)

## 2016-09-17 MED ORDER — SODIUM CHLORIDE 0.9 % IV BOLUS (SEPSIS)
2000.0000 mL | Freq: Once | INTRAVENOUS | Status: AC
  Administered 2016-09-17: 2000 mL via INTRAVENOUS

## 2016-09-17 MED ORDER — ONDANSETRON HCL 4 MG/2ML IJ SOLN: 4.0000 mg | Freq: Once | INTRAMUSCULAR | Status: DC

## 2016-09-17 NOTE — ED Notes (Addendum)
Current MD aware of the Lab results.

## 2016-09-17 NOTE — ED Notes (Signed)
Patient given sprite and ice. Denies any Nausea at this time. States that he feels 100% better

## 2016-09-17 NOTE — ED Triage Notes (Signed)
C/o n/v, body cramps started today after working outside-NAD-steady gait

## 2016-09-17 NOTE — ED Provider Notes (Signed)
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 He has been out in the hot sun for the past 2 days. He became what he believes is overheated today, developing nausea and vomiting about 2 PM he was unable to keep any fluids down because of the nausea and vomiting. He had associated generalized weakness, lightheadedness and dark yellow urine. He also had generalized muscle aching which she rated as a 6 out of 10. He was given 2 liters of normal saline IV prior to my evaluation with improvement. His nausea is better but not gone. His muscle aching is better but not gone.   Past Medical History:  Diagnosis Date  . Back pain   . Pneumonia     Past Surgical History:  Procedure Laterality Date  . APPENDECTOMY    . BACK SURGERY    . TRANSURETHRAL RESECTION OF PROSTATE N/A 12/17/2012   Procedure: TRANSURETHRAL RESECTION OF THE PROSTATE (TURP) - Unroofing of prostatic abscess;  Surgeon: Marcine Matar, MD;  Location: [facility];  Service: Urology;  Laterality: N/A;    No family history on file.  Social History  Substance Use Topics  . Smoking status: Current Every Day Smoker  . Smokeless tobacco: Never Used  . Alcohol use Yes     Comment: occ    Prior to Admission medications   Not on File    Allergies Patient has no known allergies.   REVIEW OF SYSTEMS  Negative except as noted here or in the History of Present Illness.   PHYSICAL EXAMINATION  Initial Vital Signs Blood pressure 116/73, pulse 82, temperature 98 F (36.7 C), temperature source Oral, resp. rate 18, height 5\' 9"  (1.753 m), weight 67 kg (147 lb 11.3 oz), SpO2 100 %.  Examination General: Well-developed, well-nourished male in no acute distress; appearance consistent with age of  record HENT: normocephalic; atraumatic Eyes: pupils equal, round and reactive to light; extraocular muscles intact Neck: supple Heart: regular rate and rhythm Lungs: clear to auscultation bilaterally Abdomen: soft; nondistended; nontender; bowel sounds present Extremities: No deformity; full range of motion; pulses normal Neurologic: Awake, alert and oriented; motor function intact in all extremities and symmetric; no facial droop Skin: Warm and dry Psychiatric: Normal mood and affect   RESULTS  Summary of this visit's results, reviewed by myself:   EKG Interpretation  Date/Time:    Ventricular Rate:    PR Interval:    QRS Duration:   QT Interval:    QTC Calculation:   R Axis:     Text Interpretation:        Laboratory Studies: Results for orders placed or performed during the hospital encounter of 09/17/16 (from the past 24 hour(s))  CK     Status: Abnormal   Collection Time: 09/17/16  9:30 PM  Result Value Ref Range   Total CK 719 (H) 49 - 397 U/L  Lipase, blood     Status: None   Collection Time: 09/17/16  9:37 PM  Result Value Ref Range   Lipase 35 11 - 51 U/L  Comprehensive metabolic panel     Status: Abnormal   Collection Time: 09/17/16  9:37 PM  Result Value Ref Range   Sodium 143 135 - 145 mmol/L   Potassium 4.8  3.5 - 5.1 mmol/L   Chloride 100 (L) 101 - 111 mmol/L   CO2 25 22 - 32 mmol/L   Glucose, Bld 117 (H) 65 - 99 mg/dL   BUN 28 (H) 6 - 20 mg/dL   Creatinine, Ser 7.823.38 (H) 0.61 - 1.24 mg/dL   Calcium 95.610.8 (H) 8.9 - 10.3 mg/dL   Total Protein 9.1 (H) 6.5 - 8.1 g/dL   Albumin 6.0 (H) 3.5 - 5.0 g/dL   AST 44 (H) 15 - 41 U/L   ALT 29 17 - 63 U/L   Alkaline Phosphatase 49 38 - 126 U/L   Total Bilirubin 1.5 (H) 0.3 - 1.2 mg/dL   GFR calc non Af Amer 21 (L) >60 mL/min   GFR calc Af Amer 25 (L) >60 mL/min   Anion gap 18 (H) 5 - 15  CBC     Status: Abnormal   Collection Time: 09/17/16  9:37 PM  Result Value Ref Range   WBC 23.6 (H) 4.0 - 10.5 K/uL    RBC 5.33 4.22 - 5.81 MIL/uL   Hemoglobin 17.8 (H) 13.0 - 17.0 g/dL   HCT 21.348.1 08.639.0 - 57.852.0 %   MCV 90.2 78.0 - 100.0 fL   MCH 33.4 26.0 - 34.0 pg   MCHC 37.0 (H) 30.0 - 36.0 g/dL   RDW 46.913.3 62.911.5 - 52.815.5 %   Platelets 226 150 - 400 K/uL  Urinalysis, Routine w reflex microscopic     Status: Abnormal   Collection Time: 09/17/16  9:37 PM  Result Value Ref Range   Color, Urine AMBER (A) YELLOW   APPearance TURBID (A) CLEAR   Specific Gravity, Urine 1.027 1.005 - 1.030   pH 5.0 5.0 - 8.0   Glucose, UA NEGATIVE NEGATIVE mg/dL   Hgb urine dipstick NEGATIVE NEGATIVE   Bilirubin Urine MODERATE (A) NEGATIVE   Ketones, ur 15 (A) NEGATIVE mg/dL   Protein, ur 30 (A) NEGATIVE mg/dL   Nitrite NEGATIVE NEGATIVE   Leukocytes, UA SMALL (A) NEGATIVE  Urinalysis, Microscopic (reflex)     Status: Abnormal   Collection Time: 09/17/16  9:37 PM  Result Value Ref Range   RBC / HPF NONE SEEN 0 - 5 RBC/hpf   WBC, UA 0-5 0 - 5 WBC/hpf   Bacteria, UA RARE (A) NONE SEEN   Squamous Epithelial / LPF 0-5 (A) NONE SEEN   Amorphous Crystal PRESENT   Basic metabolic panel     Status: Abnormal   Collection Time: 09/17/16 11:15 PM  Result Value Ref Range   Sodium 143 135 - 145 mmol/L   Potassium 4.5 3.5 - 5.1 mmol/L   Chloride 105 101 - 111 mmol/L   CO2 25 22 - 32 mmol/L   Glucose, Bld 107 (H) 65 - 99 mg/dL   BUN 26 (H) 6 - 20 mg/dL   Creatinine, Ser 4.133.02 (H) 0.61 - 1.24 mg/dL   Calcium 9.3 8.9 - 24.410.3 mg/dL   GFR calc non Af Amer 24 (L) >60 mL/min   GFR calc Af Amer 28 (L) >60 mL/min   Anion gap 13 5 - 15   Imaging Studies: No results found.  ED COURSE  Nursing notes and initial vitals signs, including pulse oximetry, reviewed.  Vitals:   09/17/16 2116 09/17/16 2330  BP: 116/73 100/75  Pulse: 82 76  Resp: 18 18  Temp: 98 F (36.7 C)   TempSrc: Oral   SpO2: 100% 100%  Weight: 67 kg (147 lb 11.3 oz)   Height: 5\' 9"  (  1.753 m)    12:15 AM Creatinine still greater than 3 after IV hydration. The  patient was advised that admission to [facility] was recommended. The patient has some affairs he needs to put an order but states he will present to the [facility] ED afterwards for further evaluation and treatment. He was advised that although this appears to be an acute renal failure we cannot rule out a more chronic process which could lead to hemodialysis or death. We will have him sign out AGAINST MEDICAL ADVICE and advised him to present to the [facility] ED as soon as possible.  PROCEDURES    ED DIAGNOSES     ICD-10-CM   1. Acute kidney injury (HCC) N17.9   2. Nausea and vomiting in adult R11.2        Monta Police, Jonny Ruiz, MD 09/18/16 [phone]

## 2016-09-17 NOTE — ED Notes (Signed)
Patient denies any Nausea at this time

## 2016-09-17 NOTE — ED Notes (Signed)
Pt given urinal to attempt urine sample. 

## 2016-09-18 ENCOUNTER — Encounter (HOSPITAL_COMMUNITY): Payer: Self-pay

## 2016-09-18 DIAGNOSIS — N179 Acute kidney failure, unspecified: Secondary | ICD-10-CM | POA: Insufficient documentation

## 2016-09-18 DIAGNOSIS — F17200 Nicotine dependence, unspecified, uncomplicated: Secondary | ICD-10-CM | POA: Insufficient documentation

## 2016-09-18 LAB — BASIC METABOLIC PANEL
ANION GAP: 7 (ref 5–15)
BUN: 29 mg/dL — ABNORMAL HIGH (ref 6–20)
CALCIUM: 9 mg/dL (ref 8.9–10.3)
CO2: 28 mmol/L (ref 22–32)
Chloride: 106 mmol/L (ref 101–111)
Creatinine, Ser: 1.3 mg/dL — ABNORMAL HIGH (ref 0.61–1.24)
Glucose, Bld: 94 mg/dL (ref 65–99)
Potassium: 3.6 mmol/L (ref 3.5–5.1)
Sodium: 141 mmol/L (ref 135–145)

## 2016-09-18 LAB — CBC
HCT: 39.1 % (ref 39.0–52.0)
HEMOGLOBIN: 13.9 g/dL (ref 13.0–17.0)
MCH: 32.9 pg (ref 26.0–34.0)
MCHC: 35.5 g/dL (ref 30.0–36.0)
MCV: 92.7 fL (ref 78.0–100.0)
Platelets: 166 10*3/uL (ref 150–400)
RBC: 4.22 MIL/uL (ref 4.22–5.81)
RDW: 13.5 % (ref 11.5–15.5)
WBC: 10.3 10*3/uL (ref 4.0–10.5)

## 2016-09-18 NOTE — Discharge Instructions (Signed)
You have acute renal failure. This means your kidneys are not working properly. It is not known at this time as this is a temporary condition or represents a chronic condition that could lead to the need for dialysis. Please present to the [facility] ED and advise them you were seen at [facility] and admission was recommended.

## 2016-09-18 NOTE — ED Triage Notes (Signed)
Pt states that he went to [facility] yesterday and was told he had AKI and was told to come here for admission and possible dialysis. Pt refused due to issues at home. Pt c/o of L flank pain and states he feels dehydrated and thought he was having a heat stroke yesterday.

## 2016-09-19 ENCOUNTER — Emergency Department (HOSPITAL_COMMUNITY)
Admission: EM | Admit: 2016-09-19 | Discharge: 2016-09-19 | Disposition: A | Discharge status: Home / Self Care | Payer: Self-pay | Attending: Emergency Medicine | Admitting: Emergency Medicine

## 2016-09-19 DIAGNOSIS — N179 Acute kidney failure, unspecified: Secondary | ICD-10-CM

## 2016-09-19 LAB — CK: CK TOTAL: 1177 U/L — AB (ref 49–397)

## 2016-09-19 MED ORDER — SODIUM CHLORIDE 0.9 % IV BOLUS (SEPSIS): 1000.0000 mL | Freq: Once | INTRAVENOUS | Status: DC

## 2016-09-19 MED ORDER — ACETAMINOPHEN 500 MG PO TABS: 1000.0000 mg | ORAL_TABLET | Freq: Once | ORAL | Status: DC

## 2016-09-19 MED ORDER — METHOCARBAMOL 500 MG PO TABS: 1000.0000 mg | ORAL_TABLET | Freq: Once | ORAL | Status: DC

## 2016-09-19 NOTE — ED Notes (Signed)
Pt left AMA. Pt did not want to wait for paperwork or MD to talk to him. Pt stated his dad was outside waiting for him

## 2016-09-19 NOTE — ED Notes (Signed)
Pt wants to go home and opt out of treatment. MD notified

## 2016-09-19 NOTE — ED Notes (Signed)
Called main lab to add on CK. 

## 2016-09-19 NOTE — ED Provider Notes (Signed)
[facility] DEPT Provider Note   CSN: [id number] Arrival date & time: 09/18/16  1926   By signing my name below, I, Clarisse Gouge, attest that this documentation has been prepared under the direction and in the presence of Loren Racer, MD. Electronically signed, Clarisse Gouge, ED Scribe. 09/19/16. 1:57 AM.   History   Chief Complaint Chief Complaint  Patient presents with  . Acute Kidney Injury   The history is provided by the patient and medical records. No language interpreter was used.    Jeffery Cross is a 41 y.o. male presenting to the Emergency Department After being diagnosed with acute kidney injury yesterday.  He states that on 8/16 while working outside he became lightheaded and nauseated. Vomited multiple times and was unable to keep solids or fluids down. Pt evaluated at [facility] ED that night and  was found to have acute kidney injury likely due to dehydration. Refused transfer for admission.  Currently, he reports N/V and lightheadedness have significantly improved. He's been drinking fluids. He currently describes dull, constant, moderate L flank area pain. Pt also notes some muscle cramps in his lower extremities. Denies radiation of the pain in his back to his legs. No focal weakness or numbness. No urinary incontinence or hesitancy. Occasional alcohol use reported. No diarrhea, abdominal pain or any other complaints noted at this time.   Past Medical History:  Diagnosis Date  . Back pain   . Pneumonia     Patient Active Problem List   Diagnosis Date Noted  . Prostatic abscess 12/17/2012  . Renal abscess 12/17/2012  . Tobacco abuse 12/17/2012  . Folliculitis 12/17/2012    Past Surgical History:  Procedure Laterality Date  . APPENDECTOMY    . BACK SURGERY    . TRANSURETHRAL RESECTION OF PROSTATE N/A 12/17/2012   Procedure: TRANSURETHRAL RESECTION OF THE PROSTATE (TURP) - Unroofing of prostatic abscess;  Surgeon: Marcine Matar, MD;  Location: [facility];   Service: Urology;  Laterality: N/A;       Home Medications    Prior to Admission medications   Not on File    Family History No family history on file.  Social History Social History  Substance Use Topics  . Smoking status: Current Every Day Smoker  . Smokeless tobacco: Never Used  . Alcohol use Yes     Comment: occ     Allergies   Patient has no known allergies.   Review of Systems Review of Systems  Constitutional: Positive for diaphoresis and fatigue. Negative for chills and fever.  HENT: Negative for trouble swallowing and voice change.   Eyes: Negative for visual disturbance.  Respiratory: Negative for cough and shortness of breath.   Cardiovascular: Negative for chest pain, palpitations and leg swelling.  Gastrointestinal: Positive for constipation and nausea. Negative for abdominal pain, diarrhea and vomiting.  Genitourinary: Positive for flank pain. Negative for difficulty urinating, frequency and hematuria.  Musculoskeletal: Positive for back pain and myalgias. Negative for neck pain and neck stiffness.  Skin: Negative for rash and wound.  Neurological: Positive for dizziness and light-headedness. Negative for weakness, numbness and headaches.  All other systems reviewed and are negative.    Physical Exam Updated Vital Signs BP 106/61 (BP Location: Left Arm)   Pulse 60   Temp 98.4 F (36.9 C) (Oral)   Resp 18   Ht 5\' 9"  (1.753 m)   Wt 150 lb (68 kg)   SpO2 100%   BMI 22.15 kg/m   Physical Exam  Constitutional:  He is oriented to person, place, and time. He appears well-developed and well-nourished. No distress.  HENT:  Head: Normocephalic and atraumatic.  Mouth/Throat: Oropharynx is clear and moist. No oropharyngeal exudate.  Eyes: Pupils are equal, round, and reactive to light. EOM are normal.  Neck: Normal range of motion. Neck supple.  Cardiovascular: Normal rate and regular rhythm.  Exam reveals no gallop and no friction rub.   No murmur  heard. Pulmonary/Chest: Effort normal and breath sounds normal. No respiratory distress. He has no wheezes. He has no rales. He exhibits no tenderness.  Abdominal: Soft. Bowel sounds are normal. There is no tenderness. There is no rebound and no guarding.  Musculoskeletal: Normal range of motion. He exhibits tenderness. He exhibits no edema.  Patient has inferior midline lumbar tenderness to palpation without step offs or deformity. He has diffuse lumbar paraspinal muscular tenderness without definite CVA tenderness. Negative straight leg raise bilaterally. 2+ distal pulses in all extremities. No lower extremity asymmetry, tenderness or swelling.  Neurological: He is alert and oriented to person, place, and time.  Patient is alert and oriented x3 with clear, goal oriented speech. Patient has 5/5 motor in all extremities. Sensation is intact to light touch. Patient has a normal gait and walks without assistance. Fine tremor noted throughout  Skin: Skin is warm and dry. Capillary refill takes less than 2 seconds. No rash noted. No erythema.  Psychiatric: He has a normal mood and affect. His behavior is normal.  Nursing note and vitals reviewed.    ED Treatments / Results  DIAGNOSTIC STUDIES: Oxygen Saturation is 100% on RA, NL by my interpretation.    COORDINATION OF CARE: 1:55 AM-Discussed next steps with pt. Pt verbalized understanding and is agreeable with the plan. Will order medications.   Labs (all labs ordered are listed, but only abnormal results are displayed) Labs Reviewed  BASIC METABOLIC PANEL - Abnormal; Notable for the following:       Result Value   BUN 29 (*)    Creatinine, Ser 1.30 (*)    All other components within normal limits  CK - Abnormal; Notable for the following:    Total CK 1,177 (*)    All other components within normal limits  CBC    EKG  EKG Interpretation None       Radiology No results found.  Procedures Procedures (including critical care  time)  Medications Ordered in ED Medications - No data to display   Initial Impression / Assessment and Plan / ED Course  I have reviewed the triage vital signs and the nursing notes.  Pertinent labs & imaging results that were available during my care of the patient were reviewed by me and considered in my medical decision making (see chart for details).     Patient left prior to discharge instructions and discharge paperwork.  Final Clinical Impressions(s) / ED Diagnoses   Final diagnoses:  AKI (acute kidney injury) (HCC)    New Prescriptions There are no discharge medications for this patient. I personally performed the services described in this documentation, which was scribed in my presence. The recorded information has been reviewed and is accurate.      Loren Racer, MD 09/21/16 [phone]
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
## 2019-10-25 ENCOUNTER — Encounter (HOSPITAL_COMMUNITY): Payer: Self-pay

## 2019-10-25 ENCOUNTER — Emergency Department (HOSPITAL_COMMUNITY)
Admission: EM | Admit: 2019-10-25 | Discharge: 2019-10-26 | Disposition: A | Discharge status: Home / Self Care | Payer: Self-pay | Attending: Emergency Medicine | Admitting: Emergency Medicine

## 2019-10-25 ENCOUNTER — Other Ambulatory Visit: Payer: Self-pay

## 2019-10-25 DIAGNOSIS — F17200 Nicotine dependence, unspecified, uncomplicated: Secondary | ICD-10-CM | POA: Insufficient documentation

## 2019-10-25 DIAGNOSIS — R339 Retention of urine, unspecified: Secondary | ICD-10-CM | POA: Insufficient documentation

## 2019-10-25 DIAGNOSIS — R8281 Pyuria: Secondary | ICD-10-CM | POA: Insufficient documentation

## 2019-10-25 NOTE — ED Triage Notes (Signed)
Pt unable to urinate for 2 going on 3 days. Pt states a hx of prostate cyst 6 years ago with similar symptoms.

## 2019-10-26 LAB — URINALYSIS, ROUTINE W REFLEX MICROSCOPIC
Bacteria, UA: NONE SEEN
Bilirubin Urine: NEGATIVE
Glucose, UA: NEGATIVE mg/dL
Ketones, ur: NEGATIVE mg/dL
Nitrite: POSITIVE — AB
Protein, ur: 30 mg/dL — AB
Specific Gravity, Urine: 1.019 (ref 1.005–1.030)
WBC, UA: >50 WBC/hpf — ABNORMAL HIGH (ref 0–5)
pH: 6 (ref 5.0–8.0)

## 2019-10-26 LAB — I-STAT CHEM 8, ED
BUN: 18 mg/dL (ref 6–20)
Calcium, Ion: 1.27 mmol/L (ref 1.15–1.40)
Chloride: 102 mmol/L (ref 98–111)
Creatinine, Ser: 0.8 mg/dL (ref 0.61–1.24)
Glucose, Bld: 93 mg/dL (ref 70–99)
HCT: 37 % — ABNORMAL LOW (ref 39.0–52.0)
Hemoglobin: 12.6 g/dL — ABNORMAL LOW (ref 13.0–17.0)
Potassium: 4.8 mmol/L (ref 3.5–5.1)
Sodium: 141 mmol/L (ref 135–145)
TCO2: 28 mmol/L (ref 22–32)

## 2019-10-26 MED ORDER — CEPHALEXIN 500 MG PO CAPS
500.0000 mg | ORAL_CAPSULE | Freq: Three times a day (TID) | ORAL | 0 refills | Status: AC
Start: 2019-10-26 — End: 2019-11-02

## 2019-10-26 MED ORDER — CEPHALEXIN 500 MG PO CAPS
500.0000 mg | ORAL_CAPSULE | Freq: Once | ORAL | Status: AC
  Administered 2019-10-26: 01:00:00 500 mg via ORAL
  Filled 2019-10-26: qty 1

## 2019-10-26 MED ORDER — TAMSULOSIN HCL 0.4 MG PO CAPS: 0.4000 mg | ORAL_CAPSULE | Freq: Every day | ORAL | 0 refills | Status: DC

## 2019-10-26 MED ORDER — CEPHALEXIN 500 MG PO CAPS: 500.0000 mg | ORAL_CAPSULE | Freq: Once | ORAL | Status: DC

## 2019-10-26 NOTE — ED Provider Notes (Signed)
[facility]-EMERGENCY DEPT Provider Note   CSN: [id number] Arrival date & time: 10/25/19  2339     History Chief Complaint  Patient presents with  . Urinary Retention    Jeffery Cross is a 44 y.o. male.   44 year old male with a history of prostatic and renal abscess presents to the emergency department for urinary hesitancy and retention which has been waxing and waning over the past 2 to 3 days.  Overall, symptoms have gradually worsened.  States that he was initially experiencing urinary hesitancy with inability to void at times.  Required extensive straining in order to produce some urine.  Took AZO x3 without symptomatic relief.  Has been eating and drinking less due to voiding difficulty.  Has developed some progressive lower abdominal pressure.  No fevers, back pain, penile discharge, anal pain, vomiting.  No urology f/u since 2014.  Denies known hx of BPH.  The history is provided by the patient. No language interpreter was used.       Past Medical History:  Diagnosis Date  . Back pain   . Pneumonia     Patient Active Problem List   Diagnosis Date Noted  . Prostatic abscess 12/17/2012  . Renal abscess 12/17/2012  . Tobacco abuse 12/17/2012  . Folliculitis 12/17/2012    Past Surgical History:  Procedure Laterality Date  . APPENDECTOMY    . BACK SURGERY    . TRANSURETHRAL RESECTION OF PROSTATE N/A 12/17/2012   Procedure: TRANSURETHRAL RESECTION OF THE PROSTATE (TURP) - Unroofing of prostatic abscess;  Surgeon: Marcine Matar, MD;  Location: [facility];  Service: Urology;  Laterality: N/A;       No family history on file.  Social History   Tobacco Use  . Smoking status: Current Every Day Smoker  . Smokeless tobacco: Never Used  Substance Use Topics  . Alcohol use: Yes    Comment: occ  . Drug use: Yes    Types: Marijuana    Home Medications Prior to Admission medications   Medication Sig Start Date End Date Taking? Authorizing  Provider  cephALEXin (KEFLEX) 500 MG capsule Take 1 capsule (500 mg total) by mouth 3 (three) times daily for 7 days. 10/26/19 11/02/19  Antony Madura, PA-C  tamsulosin (FLOMAX) 0.4 MG CAPS capsule Take 1 capsule (0.4 mg total) by mouth daily. 10/26/19   Antony Madura, PA-C    Allergies    Patient has no known allergies.  Review of Systems   Review of Systems  Ten systems reviewed and are negative for acute change, except as noted in the HPI.    Physical Exam Updated Vital Signs BP 107/73 (BP Location: Left Arm)   Pulse 93   Temp 98.6 F (37 C) (Oral)   Resp 16   Ht 5\' 9"  (1.753 m)   Wt 68 kg   SpO2 98%   BMI 22.15 kg/m   Physical Exam Vitals and nursing note reviewed.  Constitutional:      General: He is not in acute distress.    Appearance: He is well-developed. He is not diaphoretic.     Comments: Nontoxic appearing and in NAD  HENT:     Head: Normocephalic and atraumatic.  Eyes:     General: No scleral icterus.    Conjunctiva/sclera: Conjunctivae normal.  Pulmonary:     Effort: Pulmonary effort is normal. No respiratory distress.     Comments: Respirations even and unlabored Abdominal:     Comments: No focal abdominal TTP.  Genitourinary:    Comments: 1100cc in foley bag. Urine amber in color. No blood. Musculoskeletal:        General: Normal range of motion.     Cervical back: Normal range of motion.  Skin:    General: Skin is warm and dry.     Coloration: Skin is not pale.     Findings: No erythema or rash.  Neurological:     Mental Status: He is alert and oriented to person, place, and time.  Psychiatric:        Behavior: Behavior normal.     ED Results / Procedures / Treatments   Labs (all labs ordered are listed, but only abnormal results are displayed) Labs Reviewed  URINALYSIS, ROUTINE W REFLEX MICROSCOPIC - Abnormal; Notable for the following components:      Result Value   Color, Urine AMBER (*)    Hgb urine dipstick MODERATE (*)    Protein,  ur 30 (*)    Nitrite POSITIVE (*)    Leukocytes,Ua SMALL (*)    WBC, UA >50 (*)    All other components within normal limits  I-STAT CHEM 8, ED - Abnormal; Notable for the following components:   Hemoglobin 12.6 (*)    HCT 37.0 (*)    All other components within normal limits  URINE CULTURE  GC/CHLAMYDIA PROBE AMP ([place]) NOT AT [facility]    EKG None  Radiology No results found.  Procedures Procedures (including critical care time)  Medications Ordered in ED Medications  cephALEXin (KEFLEX) capsule 500 mg (500 mg Oral Given 10/26/19 0103)    ED Course  I have reviewed the triage vital signs and the nursing notes.  Pertinent labs & imaging results that were available during my care of the patient were reviewed by me and considered in my medical decision making (see chart for details).    MDM Rules/Calculators/A&P                          44 year old male presenting to the emergency department for evaluation of urinary retention x2 to 3 days.  Bladder scan with >999cc urine in bladder. Ultimately with output of ~1100cc after placement of 14Fr foley catheter.  Urinalysis notable for pyuria and positive nitrites.  No bacteriuria.  Urine culture pending, but will treat for presumed UTI with Keflex.  Also placed on course of Flomax pending outpatient urology follow-up.  Return precautions discussed and provided.  Patient discharged in stable condition with no unaddressed concerns.   Final Clinical Impression(s) / ED Diagnoses Final diagnoses:  Urinary retention  Pyuria    Rx / DC Orders ED Discharge Orders         Ordered    cephALEXin (KEFLEX) 500 MG capsule  3 times daily        10/26/19 0116    tamsulosin (FLOMAX) 0.4 MG CAPS capsule  Daily        10/26/19 0116           Antony Madura, PA-C 10/26/19 0215    Molpus, Jonny Ruiz, MD 10/26/19 [phone]

## 2019-10-26 NOTE — Discharge Instructions (Signed)
You are leaving the emergency department with a Foley catheter to prevent any recurrent retention.  Call the office of [facility] in the morning to schedule close follow-up to have your symptoms reassessed and your Foley catheter removed.  We recommend that you take Flomax daily as prescribed.  Your urine today did suggest a urinary tract infection.  You have been placed on a course of Keflex.  Take this antibiotic as prescribed until finished.  Do not stop this antibiotic early.  Return to the ED for new or concerning symptoms.

## 2019-10-27 LAB — URINE CULTURE: Culture: >=100000 — AB

## 2021-04-14 ENCOUNTER — Other Ambulatory Visit: Payer: Self-pay

## 2021-04-14 ENCOUNTER — Emergency Department (HOSPITAL_BASED_OUTPATIENT_CLINIC_OR_DEPARTMENT_OTHER): Payer: Medicaid Other

## 2021-04-14 ENCOUNTER — Emergency Department (HOSPITAL_BASED_OUTPATIENT_CLINIC_OR_DEPARTMENT_OTHER)
Admission: EM | Admit: 2021-04-14 | Discharge: 2021-04-14 | Disposition: A | Discharge status: Home / Self Care | Payer: Medicaid Other | Attending: Emergency Medicine | Admitting: Emergency Medicine

## 2021-04-14 ENCOUNTER — Encounter (HOSPITAL_BASED_OUTPATIENT_CLINIC_OR_DEPARTMENT_OTHER): Payer: Self-pay | Admitting: *Deleted

## 2021-04-14 DIAGNOSIS — N200 Calculus of kidney: Secondary | ICD-10-CM | POA: Insufficient documentation

## 2021-04-14 LAB — URINALYSIS, ROUTINE W REFLEX MICROSCOPIC
Bilirubin Urine: NEGATIVE
Glucose, UA: NEGATIVE mg/dL
Ketones, ur: 40 mg/dL — AB
Leukocytes,Ua: NEGATIVE
Nitrite: NEGATIVE
Protein, ur: 30 mg/dL — AB
Specific Gravity, Urine: 1.038 — ABNORMAL HIGH (ref 1.005–1.030)
pH: 6 (ref 5.0–8.0)

## 2021-04-14 LAB — BASIC METABOLIC PANEL
Anion gap: 11 (ref 5–15)
BUN: 21 mg/dL — ABNORMAL HIGH (ref 6–20)
CO2: 26 mmol/L (ref 22–32)
Calcium: 9.9 mg/dL (ref 8.9–10.3)
Chloride: 98 mmol/L (ref 98–111)
Creatinine, Ser: 1.06 mg/dL (ref 0.61–1.24)
GFR, Estimated: >60 mL/min (ref >60)
Glucose, Bld: 103 mg/dL — ABNORMAL HIGH (ref 70–99)
Potassium: 3.7 mmol/L (ref 3.5–5.1)
Sodium: 135 mmol/L (ref 135–145)

## 2021-04-14 LAB — CBC WITH DIFFERENTIAL/PLATELET
Abs Immature Granulocytes: 0.04 10*3/uL (ref 0.00–0.07)
Basophils Absolute: 0 10*3/uL (ref 0.0–0.1)
Basophils Relative: 0 %
Eosinophils Absolute: 0 10*3/uL (ref 0.0–0.5)
Eosinophils Relative: 0 %
HCT: 41.6 % (ref 39.0–52.0)
Hemoglobin: 14.7 g/dL (ref 13.0–17.0)
Immature Granulocytes: 0 %
Lymphocytes Relative: 6 %
Lymphs Abs: 0.9 10*3/uL (ref 0.7–4.0)
MCH: 30.9 pg (ref 26.0–34.0)
MCHC: 35.3 g/dL (ref 30.0–36.0)
MCV: 87.6 fL (ref 80.0–100.0)
Monocytes Absolute: 0.9 10*3/uL (ref 0.1–1.0)
Monocytes Relative: 6 %
Neutro Abs: 13.4 10*3/uL — ABNORMAL HIGH (ref 1.7–7.7)
Neutrophils Relative %: 88 %
Platelets: 148 10*3/uL — ABNORMAL LOW (ref 150–400)
RBC: 4.75 MIL/uL (ref 4.22–5.81)
RDW: 13.3 % (ref 11.5–15.5)
WBC: 15.3 10*3/uL — ABNORMAL HIGH (ref 4.0–10.5)
nRBC: 0 % (ref 0.0–0.2)

## 2021-04-14 MED ORDER — NAPROXEN 500 MG PO TABS: 500.0000 mg | ORAL_TABLET | Freq: Two times a day (BID) | ORAL | 0 refills | Status: AC

## 2021-04-14 MED ORDER — MORPHINE SULFATE (PF) 4 MG/ML IV SOLN
4.0000 mg | Freq: Once | INTRAVENOUS | Status: AC
  Administered 2021-04-14: 4 mg via INTRAVENOUS
  Filled 2021-04-14: qty 1

## 2021-04-14 MED ORDER — TAMSULOSIN HCL 0.4 MG PO CAPS: 0.4000 mg | ORAL_CAPSULE | Freq: Every day | ORAL | 0 refills | Status: AC

## 2021-04-14 MED ORDER — ONDANSETRON HCL 4 MG/2ML IJ SOLN
4.0000 mg | Freq: Once | INTRAMUSCULAR | Status: AC
  Administered 2021-04-14: 4 mg via INTRAVENOUS
  Filled 2021-04-14: qty 2

## 2021-04-14 MED ORDER — SODIUM CHLORIDE 0.9 % IV BOLUS
1000.0000 mL | Freq: Once | INTRAVENOUS | Status: AC
  Administered 2021-04-14: 1000 mL via INTRAVENOUS

## 2021-04-14 MED ORDER — KETOROLAC TROMETHAMINE 30 MG/ML IJ SOLN
30.0000 mg | Freq: Once | INTRAMUSCULAR | Status: AC
  Administered 2021-04-14: 30 mg via INTRAVENOUS
  Filled 2021-04-14: qty 1

## 2021-04-14 NOTE — Discharge Instructions (Signed)
You were seen today for flank pain.  Your CT scan shows kidney stones.  Years will likely pass on its own.  Make sure that you are staying hydrated.  Take medications as directed.  Limit naproxen use for 5 days. ?

## 2021-04-14 NOTE — ED Triage Notes (Signed)
C/o right flank pain that started a few hours ago. C/o of not urinating a lot, but does not feel like he is retaining urine. Pt unable to sit still. Took excedrin migraine 3 hours. C/o nauseated. Denies vomiting.  ?
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
## 2022-01-15 ENCOUNTER — Other Ambulatory Visit (HOSPITAL_COMMUNITY): Payer: Self-pay

## 2022-04-10 DIAGNOSIS — F1120 Opioid dependence, uncomplicated: Secondary | ICD-10-CM | POA: Diagnosis not present

## 2022-07-05 DIAGNOSIS — Z743 Need for continuous supervision: Secondary | ICD-10-CM | POA: Diagnosis not present

## 2022-07-05 DIAGNOSIS — R55 Syncope and collapse: Secondary | ICD-10-CM | POA: Diagnosis not present

## 2022-07-05 DIAGNOSIS — T50904A Poisoning by unspecified drugs, medicaments and biological substances, undetermined, initial encounter: Secondary | ICD-10-CM | POA: Diagnosis not present

## 2023-03-04 DIAGNOSIS — Z79899 Other long term (current) drug therapy: Secondary | ICD-10-CM | POA: Diagnosis not present

## 2023-10-01 IMAGING — CT CT RENAL STONE PROTOCOL
2 of 4 series · 16 of 46 positions shown, 18 images · non-contrast
Comparison: CT abdomen pelvis dated 12/12/2014.

CLINICAL DATA: Flank pain.  Concern for kidney stone.



[Series 2: stone full · axial · 0.76mm/px · z∈[+785,+1210]mm · 13 of 93 slices shown, 15 images]
[im 4/93  soft-tissue]
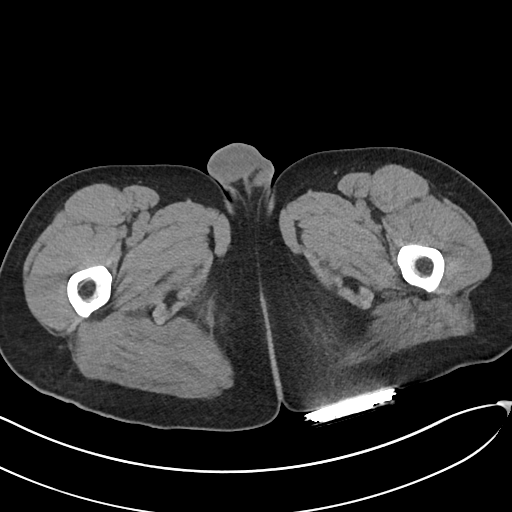
[im 4/93  bone]
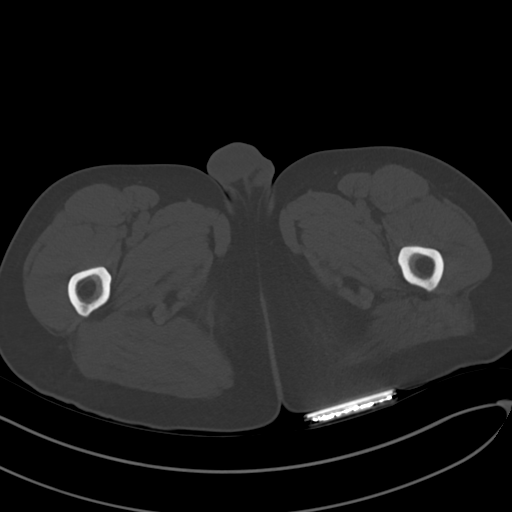
[im 12/93  soft-tissue]
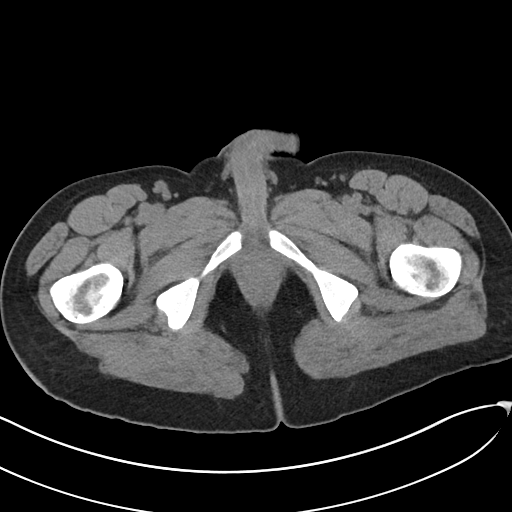
[im 19/93  soft-tissue]
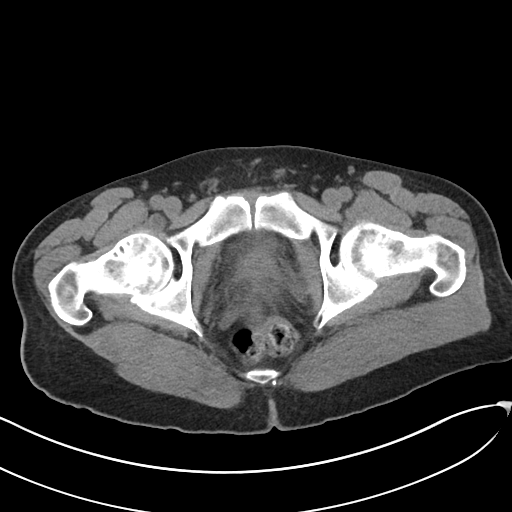
[im 26/93  soft-tissue]
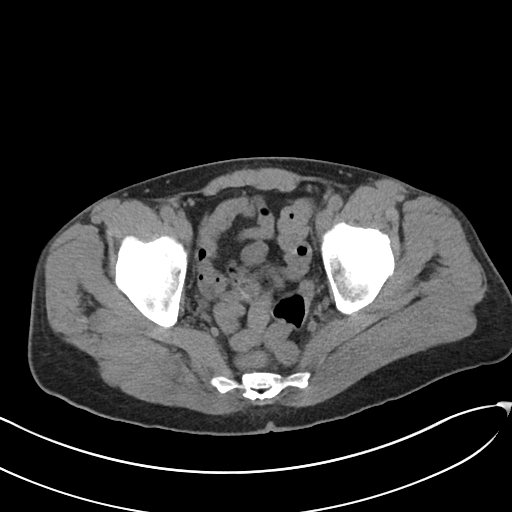
[im 34/93  soft-tissue]
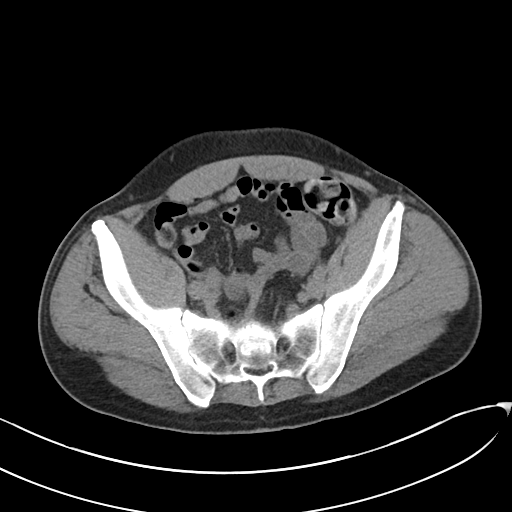
[im 41/93  soft-tissue]
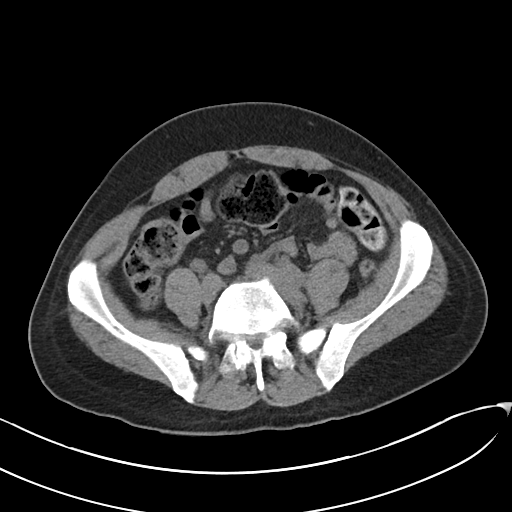
[im 48/93  soft-tissue]
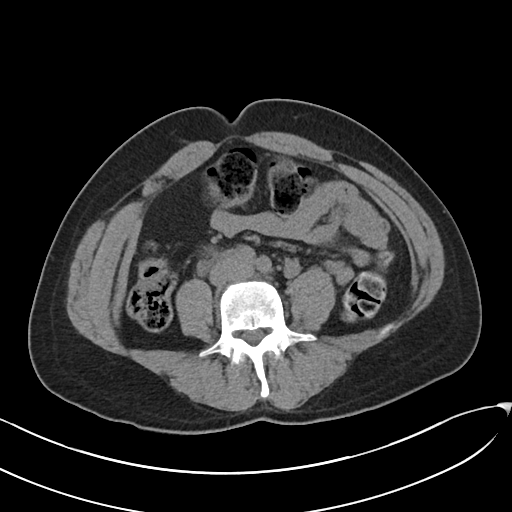
[im 52/93  soft-tissue]
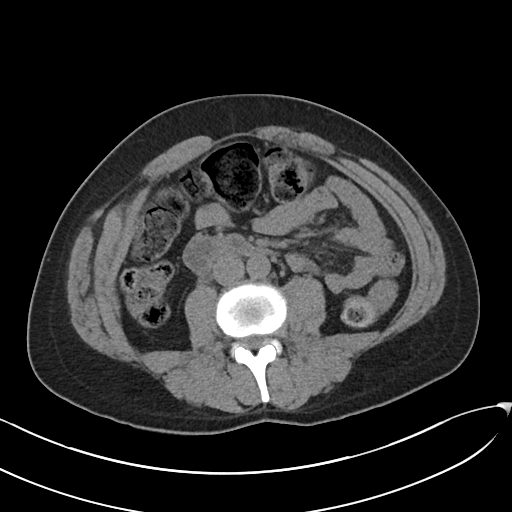
[im 59/93  soft-tissue]
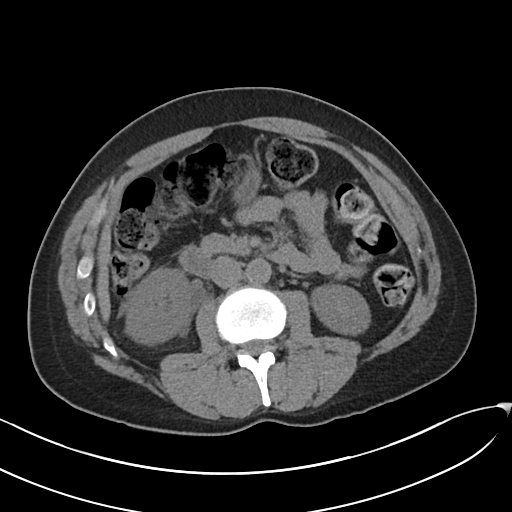
[im 59/93  bone]
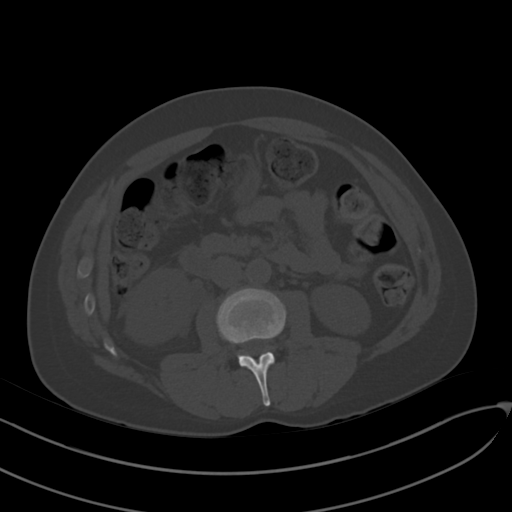
[im 67/93  soft-tissue]
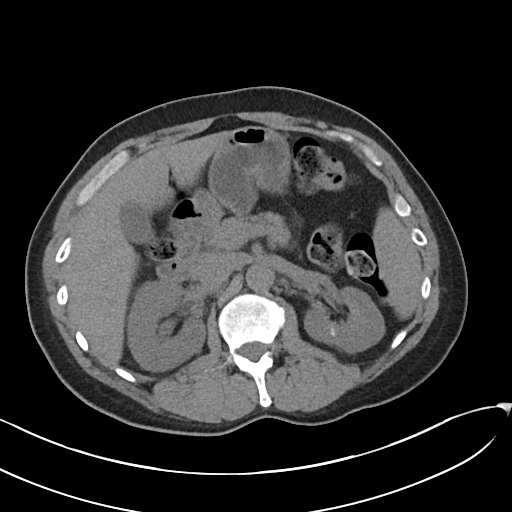
[im 74/93  soft-tissue]
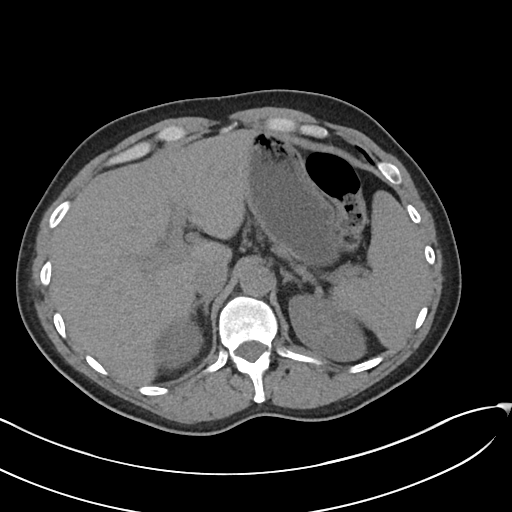
[im 81/93  soft-tissue]
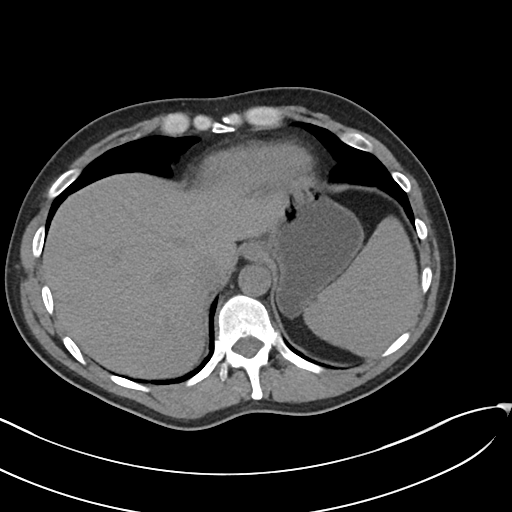
[im 89/93  soft-tissue]
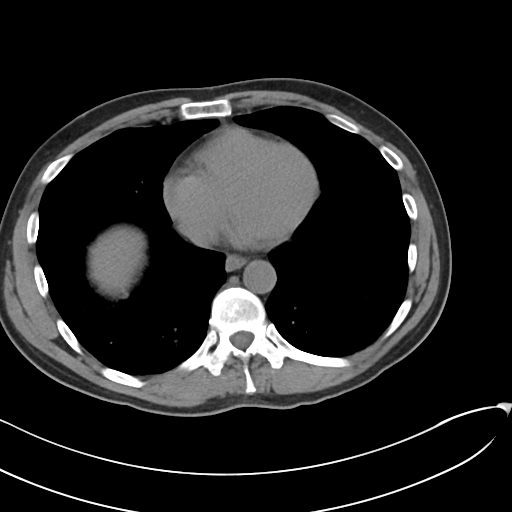

[Series 5: coronal · coronal · 0.82mm/px · 3 of 91 slices shown]
[im 31/91  soft-tissue]
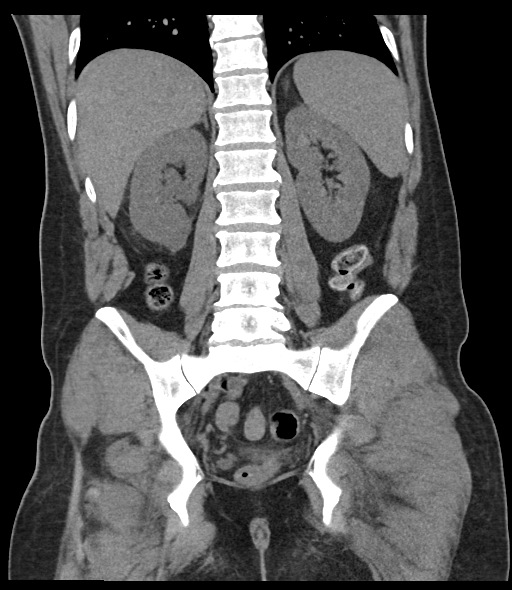
[im 41/91  soft-tissue]
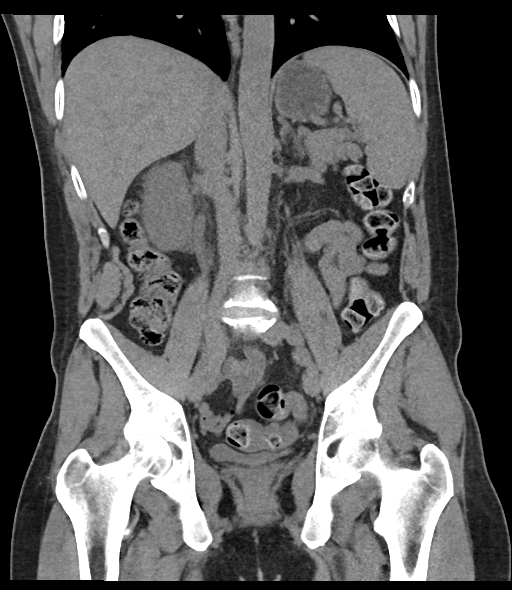
[im 51/91  soft-tissue]
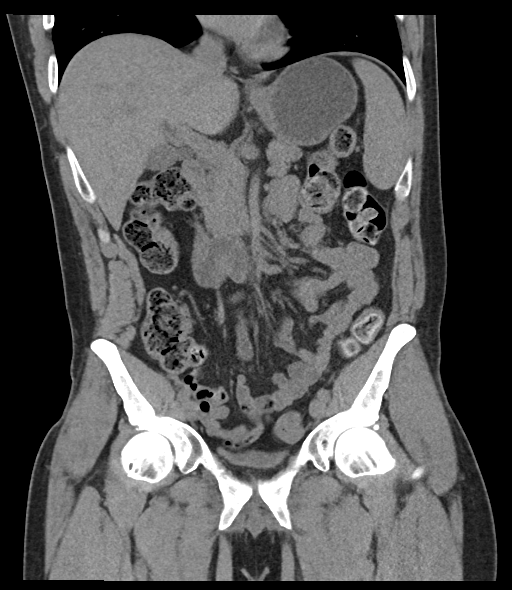

[16 of 46 positions shown; findings below may reference images not displayed]

FINDINGS: Evaluation of this exam is limited in the absence of intravenous
contrast.

Lower chest: The visualized lung bases are clear.

No intra-abdominal free air or free fluid.

Hepatobiliary: No focal liver abnormality is seen. No gallstones,
gallbladder wall thickening, or biliary dilatation.

Pancreas: Unremarkable. No pancreatic ductal dilatation or
surrounding inflammatory changes.

Spleen: Normal in size without focal abnormality.

Adrenals/Urinary Tract: The adrenal glands unremarkable. There is a
punctate stone at the right ureterovesical junction (38/5). There is
mild right hydronephrosis. Several additional punctate
nonobstructing calculi in the right kidney. There is a 3 mm
nonobstructing stone in the interpolar left kidney. There is no
hydronephrosis on the left. The urinary bladder is collapsed.

Stomach/Bowel: Moderate stool throughout the colon. No bowel
obstruction or active inflammation. Appendectomy.

Vascular/Lymphatic: The abdominal aorta and IVC are unremarkable on
this noncontrast CT. No portal venous gas. There is no adenopathy.

Reproductive: The prostate and seminal vesicles are grossly
unremarkable. No pelvic mass.

Other: None

Musculoskeletal: Mild degenerative changes of the spine. No acute
osseous pathology.
IMPRESSION: A punctate right UVJ calculus with mild right hydronephrosis.
Several additional nonobstructing bilateral renal calculi. No
hydronephrosis on the left.

## 2023-12-08 ENCOUNTER — Other Ambulatory Visit: Payer: Self-pay

## 2023-12-08 ENCOUNTER — Emergency Department (HOSPITAL_BASED_OUTPATIENT_CLINIC_OR_DEPARTMENT_OTHER)

## 2023-12-08 ENCOUNTER — Emergency Department (HOSPITAL_BASED_OUTPATIENT_CLINIC_OR_DEPARTMENT_OTHER)
Admission: EM | Admit: 2023-12-08 | Discharge: 2023-12-08 | Disposition: A | Discharge status: Home / Self Care | Attending: Emergency Medicine | Admitting: Emergency Medicine

## 2023-12-08 ENCOUNTER — Encounter (HOSPITAL_BASED_OUTPATIENT_CLINIC_OR_DEPARTMENT_OTHER): Payer: Self-pay | Admitting: Emergency Medicine

## 2023-12-08 DIAGNOSIS — N132 Hydronephrosis with renal and ureteral calculous obstruction: Secondary | ICD-10-CM | POA: Insufficient documentation

## 2023-12-08 DIAGNOSIS — N200 Calculus of kidney: Secondary | ICD-10-CM

## 2023-12-08 DIAGNOSIS — R10A2 Flank pain, left side: Secondary | ICD-10-CM | POA: Diagnosis present

## 2023-12-08 LAB — CBC WITH DIFFERENTIAL/PLATELET
Abs Immature Granulocytes: 0.03 K/uL (ref 0.00–0.07)
Basophils Absolute: 0 K/uL (ref 0.0–0.1)
Basophils Relative: 0 %
Eosinophils Absolute: 0.1 K/uL (ref 0.0–0.5)
Eosinophils Relative: 1 %
HCT: 37.3 % — ABNORMAL LOW (ref 39.0–52.0)
Hemoglobin: 12.9 g/dL — ABNORMAL LOW (ref 13.0–17.0)
Immature Granulocytes: 0 %
Lymphocytes Relative: 14 %
Lymphs Abs: 1.8 K/uL (ref 0.7–4.0)
MCH: 30.8 pg (ref 26.0–34.0)
MCHC: 34.6 g/dL (ref 30.0–36.0)
MCV: 89 fL (ref 80.0–100.0)
Monocytes Absolute: 1 K/uL (ref 0.1–1.0)
Monocytes Relative: 7 %
Neutro Abs: 10.3 K/uL — ABNORMAL HIGH (ref 1.7–7.7)
Neutrophils Relative %: 78 %
Platelets: 247 K/uL (ref 150–400)
RBC: 4.19 MIL/uL — ABNORMAL LOW (ref 4.22–5.81)
RDW: 13.3 % (ref 11.5–15.5)
WBC: 13.2 K/uL — ABNORMAL HIGH (ref 4.0–10.5)
nRBC: 0 % (ref 0.0–0.2)

## 2023-12-08 LAB — BASIC METABOLIC PANEL WITH GFR
Anion gap: 8 (ref 5–15)
BUN: 20 mg/dL (ref 6–20)
CO2: 29 mmol/L (ref 22–32)
Calcium: 9.7 mg/dL (ref 8.9–10.3)
Chloride: 101 mmol/L (ref 98–111)
Creatinine, Ser: 1.46 mg/dL — ABNORMAL HIGH (ref 0.61–1.24)
GFR, Estimated: 59 mL/min — ABNORMAL LOW (ref >60)
Glucose, Bld: 100 mg/dL — ABNORMAL HIGH (ref 70–99)
Potassium: 4.6 mmol/L (ref 3.5–5.1)
Sodium: 138 mmol/L (ref 135–145)

## 2023-12-08 LAB — URINALYSIS, ROUTINE W REFLEX MICROSCOPIC
Bacteria, UA: NONE SEEN
Bilirubin Urine: NEGATIVE
Glucose, UA: NEGATIVE mg/dL
Leukocytes,Ua: NEGATIVE
Nitrite: NEGATIVE
Specific Gravity, Urine: 1.029 (ref 1.005–1.030)
pH: 6.5 (ref 5.0–8.0)

## 2023-12-08 MED ORDER — FENTANYL CITRATE (PF) 50 MCG/ML IJ SOSY
100.0000 ug | PREFILLED_SYRINGE | Freq: Once | INTRAMUSCULAR | Status: AC
  Administered 2023-12-08: 100 ug via INTRAVENOUS
  Filled 2023-12-08: qty 2

## 2023-12-08 MED ORDER — ONDANSETRON 4 MG PO TBDP: 4.0000 mg | ORAL_TABLET | Freq: Three times a day (TID) | ORAL | 0 refills | Status: AC | PRN

## 2023-12-08 MED ORDER — TAMSULOSIN HCL 0.4 MG PO CAPS: 0.4000 mg | ORAL_CAPSULE | Freq: Every day | ORAL | 0 refills | Status: AC

## 2023-12-08 MED ORDER — NAPROXEN 500 MG PO TABS: 500.0000 mg | ORAL_TABLET | Freq: Two times a day (BID) | ORAL | 0 refills | Status: AC

## 2023-12-08 MED ORDER — SODIUM CHLORIDE 0.9 % IV BOLUS
1000.0000 mL | Freq: Once | INTRAVENOUS | Status: AC
  Administered 2023-12-08: 1000 mL via INTRAVENOUS

## 2023-12-08 MED ORDER — KETOROLAC TROMETHAMINE 30 MG/ML IJ SOLN
30.0000 mg | Freq: Once | INTRAMUSCULAR | Status: AC
  Administered 2023-12-08: 30 mg via INTRAVENOUS
  Filled 2023-12-08: qty 1

## 2023-12-08 NOTE — ED Triage Notes (Addendum)
  Patient comes in with left flank pain that started yesterday afternoon. Hx of kidney stones.  Endorses sharp 10/10 pain.  Unsure of dysuria.  Endorses nausea with no vomiting.    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

## 2023-12-08 NOTE — ED Provider Notes (Signed)
 [place] EMERGENCY DEPARTMENT AT [facility] Provider Note   CSN: [id number] Arrival date & time: 12/08/23  0459     Patient presents with: Flank Pain   Jeffery Cross is a 48 y.o. male.   HPI     This 48 year old male who presents with left flank pain.  Patient reports history of kidney stones.  Reports progressive worsening left flank pain over the last 24 to 48 hours.  It is over the left flank and radiates in the left lower quadrant.  Reports nausea without vomiting.  No fevers.  Patient is a heroin addict and last used 6 hours ago.  States that the heroin did not help with this pain.  Denies dysuria.  Prior to Admission medications   Medication Sig Start Date End Date Taking? Authorizing Provider  naproxen  (NAPROSYN ) 500 MG tablet Take 1 tablet (500 mg total) by mouth 2 (two) times daily. 04/14/21   Parmvir Boomer, Charmaine FALCON, MD  tamsulosin  (FLOMAX ) 0.4 MG CAPS capsule Take 1 capsule (0.4 mg total) by mouth daily. 04/14/21   Terrell Ostrand, Charmaine FALCON, MD    Allergies: Patient has no known allergies.    Review of Systems  Constitutional:  Negative for fever.  Gastrointestinal:  Positive for nausea.  Genitourinary:  Positive for flank pain. Negative for dysuria.  All other systems reviewed and are negative.   Updated Vital Signs BP (!) 136/92 (BP Location: Left Arm)   Pulse 79   Temp 98.9 F (37.2 C) (Oral)   Resp (!) 22   Ht 1.753 m (5' 9)   Wt 68 kg   SpO2 98%   BMI 22.15 kg/m   Physical Exam Vitals and nursing note reviewed.  Constitutional:      Appearance: He is well-developed. He is not ill-appearing.     Comments: Uncomfortable but nontoxic-appearing  HENT:     Head: Normocephalic and atraumatic.  Eyes:     Pupils: Pupils are equal, round, and reactive to light.  Cardiovascular:     Rate and Rhythm: Normal rate and regular rhythm.     Heart sounds: Normal heart sounds. No murmur heard. Pulmonary:     Effort: Pulmonary effort is normal. No respiratory  distress.     Breath sounds: Normal breath sounds. No wheezing.  Abdominal:     Palpations: Abdomen is soft.     Tenderness: There is no abdominal tenderness. There is left CVA tenderness. There is no right CVA tenderness or rebound.  Musculoskeletal:     Cervical back: Neck supple.  Lymphadenopathy:     Cervical: No cervical adenopathy.  Skin:    General: Skin is warm and dry.  Neurological:     Mental Status: He is alert and oriented to person, place, and time.     (all labs ordered are listed, but only abnormal results are displayed) Labs Reviewed  CBC WITH DIFFERENTIAL/PLATELET - Abnormal; Notable for the following components:      Result Value   WBC 13.2 (*)    RBC 4.19 (*)    Hemoglobin 12.9 (*)    HCT 37.3 (*)    Neutro Abs 10.3 (*)    All other components within normal limits  BASIC METABOLIC PANEL WITH GFR - Abnormal; Notable for the following components:   Glucose, Bld 100 (*)    Creatinine, Ser 1.46 (*)    GFR, Estimated 59 (*)    All other components within normal limits  URINALYSIS, ROUTINE W REFLEX MICROSCOPIC - Abnormal; Notable for the  following components:   Hgb urine dipstick TRACE (*)    Ketones, ur TRACE (*)    Protein, ur TRACE (*)    All other components within normal limits    EKG: None  Radiology: CT Renal Stone Study Result Date: 12/08/2023 EXAM: CT UROGRAM 12/08/2023 05:49:00 AM TECHNIQUE: CT of the abdomen and pelvis was performed before and after the administration of intravenous contrast as per CT urogram protocol. Multiplanar reformatted images as well as MIP urogram images are provided for review. Automated exposure control, iterative reconstruction, and/or weight based adjustment of the mA/kV was utilized to reduce the radiation dose to as low as reasonably achievable. COMPARISON: CT abdomen and pelvis 04/14/2021. CLINICAL HISTORY: 48 year old male. Abdominal/flank pain, stone suspected. FINDINGS: LOWER CHEST: Normal heart size and no  pericardial or pleural effusion. Similar lung base atelectasis or scarring compared to 04/14/2021. LIVER: The liver is unremarkable. GALLBLADDER AND BILE DUCTS: Gallbladder is unremarkable. No biliary ductal dilatation. SPLEEN: No acute abnormality. PANCREAS: No acute abnormality. ADRENAL GLANDS: No acute abnormality. KIDNEYS, URETERS AND BLADDER: Punctate right nephrolithiasis. Nonobstructing right kidney now. Left nephrolithiasis ranging from punctate to 4 mm. Left hydronephrosis and nephromegaly. Left hydroureter with indistinct left periureteral stranding which continues into the pelvis. On series 2 image 75, a distal obstructing left renal calculus is identified measuring 3 mm on coronal image 35. This is about 1 cm from the left ureterovesical junction. The bladder is decompressed. GI AND BOWEL: Redundant but decompressed large bowel in the pelvis. Moderate volume upstream retained stool in the descending and transverse colon. Redemonstrated right colon with similar retained stool. Diminutive or absent appendix, not identified. Nondilated stomach and small bowel. PERITONEUM AND RETROPERITONEUM: No ascites. No free air. VASCULATURE: Aorta is normal in caliber. Faint calcified atherosclerosis of the abdominal aorta. LYMPH NODES: No lymphadenopathy. REPRODUCTIVE ORGANS: No acute abnormality. BONES AND SOFT TISSUES: Chronic grade 1 anterolisthesis at the lumbosacral junction. Age advanced L4-L5 and L5-S1 degeneration. No acute osseous abnormality. No focal soft tissue abnormality. IMPRESSION: 1. Obstructing distal left ureteral calculus (3 mm) approximately 1 cm from the ureterovesical junction with left hydronephrosis. 2. Bilateral nephrolithiasis. Electronically signed by: Helayne Hurst MD 12/08/2023 05:59 AM EST RP Workstation: [id number]     Procedures   Medications Ordered in the ED  ketorolac  (TORADOL ) 30 MG/ML injection 30 mg (has no administration in time range)  sodium chloride  0.9 % bolus 1,000 mL  (1,000 mLs Intravenous New Bag/Given 12/08/23 0531)  fentaNYL  (SUBLIMAZE ) injection 100 mcg (100 mcg Intravenous Given 12/08/23 0534)                                    Medical Decision Making Amount and/or Complexity of Data Reviewed Labs: ordered. Radiology: ordered.  Risk Prescription drug management.   This patient presents to the ED for concern of left flank pain, this involves an extensive number of treatment options, and is a complaint that carries with it a high risk of complications and morbidity.  I considered the following differential and admission for this acute, potentially life threatening condition.  The differential diagnosis includes kidney stone, UTI, pyelonephritis  MDM:    This is a 48 year old male with a history of kidney stones and here with abuse who presents with concern for left flank pain.  He is uncomfortable appearing but nontoxic.  He is afebrile.  Urinalysis without evidence of UTI.  Creatinine is 1.46.  Previously has  had some AKI but had normalized.  CT scan does show a 3 mm stone on the left.  This is the likely culprit.  Patient was given a dose of Toradol  in addition to a small dose of fentanyl .  On recheck, he is much more comfortable.  I discussed with him given his ongoing heroin use, NSAIDs would likely be the most appropriate treatment of his pain.  However he it will be important for him to stay hydrated.  He was given a liter of fluids.  Will provide urology follow-up.  (Labs, imaging, consults)  Labs: I Ordered, and personally interpreted labs.  The pertinent results include: CBC, BMP, urinalysis  Imaging Studies ordered: I ordered imaging studies including CT stone study I independently visualized and interpreted imaging. I agree with the radiologist interpretation  Additional history obtained from chart review.  External records from outside source obtained and reviewed including prior evaluations  Cardiac Monitoring: The patient was  maintained on a cardiac monitor.  If on the cardiac monitor, I personally viewed and interpreted the cardiac monitored which showed an underlying rhythm of: Sinus  Reevaluation: After the interventions noted above, I reevaluated the patient and found that they have :improved  Social Determinants of Health:  polysubstance abuse  Disposition: Discharge  Co morbidities that complicate the patient evaluation  Past Medical History:  Diagnosis Date   Back pain    Pneumonia      Medicines Meds ordered this encounter  Medications   sodium chloride  0.9 % bolus 1,000 mL   fentaNYL  (SUBLIMAZE ) injection 100 mcg   ketorolac  (TORADOL ) 30 MG/ML injection 30 mg    I have reviewed the patients home medicines and have made adjustments as needed  Problem List / ED Course: Problem List Items Addressed This Visit   None Visit Diagnoses       Kidney stone    -  Primary   Relevant Medications   fentaNYL  (SUBLIMAZE ) injection 100 mcg (Completed)                Final diagnoses:  Kidney stone    ED Discharge Orders     None          Bari Charmaine FALCON, MD 12/08/23 [phone]

## 2023-12-08 NOTE — Discharge Instructions (Addendum)
 You were seen today for flank pain.  You are found to have kidney stones.  Make sure that you are staying hydrated.  Take medications as prescribed.  Follow-up with urology as needed.

## 2023-12-08 NOTE — ED Notes (Signed)
 Awaiting CT scan. Father at bedside.
